# Patient Record
Sex: Female | Born: 1945 | Race: White | Hispanic: No | Marital: Married | Smoking: Former smoker
Health system: Southern US, Community
[De-identification: ages and names within clinical notes are randomized; demographics above are authoritative.]

## PROBLEM LIST (undated history)

## (undated) DIAGNOSIS — I10 Essential (primary) hypertension: Secondary | ICD-10-CM

## (undated) DIAGNOSIS — Z9221 Personal history of antineoplastic chemotherapy: Secondary | ICD-10-CM

## (undated) DIAGNOSIS — I00 Rheumatic fever without heart involvement: Secondary | ICD-10-CM

## (undated) DIAGNOSIS — E039 Hypothyroidism, unspecified: Secondary | ICD-10-CM

## (undated) DIAGNOSIS — C801 Malignant (primary) neoplasm, unspecified: Secondary | ICD-10-CM

## (undated) DIAGNOSIS — F32A Depression, unspecified: Secondary | ICD-10-CM

## (undated) DIAGNOSIS — T7840XA Allergy, unspecified, initial encounter: Secondary | ICD-10-CM

## (undated) DIAGNOSIS — F329 Major depressive disorder, single episode, unspecified: Secondary | ICD-10-CM

## (undated) DIAGNOSIS — E079 Disorder of thyroid, unspecified: Secondary | ICD-10-CM

## (undated) DIAGNOSIS — E785 Hyperlipidemia, unspecified: Secondary | ICD-10-CM

## (undated) HISTORY — DX: Allergy, unspecified, initial encounter: T78.40XA

## (undated) HISTORY — DX: Disorder of thyroid, unspecified: E07.9

## (undated) HISTORY — PX: POLYPECTOMY: SHX149

## (undated) HISTORY — PX: MASTECTOMY: SHX3

## (undated) HISTORY — PX: COLONOSCOPY: SHX174

## (undated) HISTORY — PX: BREAST RECONSTRUCTION: SHX9

## (undated) HISTORY — DX: Malignant (primary) neoplasm, unspecified: C80.1

## (undated) HISTORY — PX: BREAST IMPLANT REMOVAL: SHX5361

## (undated) HISTORY — DX: Hyperlipidemia, unspecified: E78.5

---

## 1980-09-17 HISTORY — PX: BREAST EXCISIONAL BIOPSY: SUR124

## 2000-10-04 ENCOUNTER — Ambulatory Visit (HOSPITAL_COMMUNITY): Admission: RE | Admit: 2000-10-04 | Discharge: 2000-10-04 | Payer: Self-pay | Admitting: Internal Medicine

## 2005-05-04 ENCOUNTER — Ambulatory Visit: Payer: Self-pay | Admitting: Obstetrics and Gynecology

## 2005-09-20 ENCOUNTER — Ambulatory Visit: Payer: Self-pay | Admitting: Internal Medicine

## 2005-09-28 ENCOUNTER — Encounter (INDEPENDENT_AMBULATORY_CARE_PROVIDER_SITE_OTHER): Payer: Self-pay | Admitting: *Deleted

## 2005-09-28 ENCOUNTER — Ambulatory Visit: Payer: Self-pay | Admitting: Internal Medicine

## 2006-05-06 ENCOUNTER — Ambulatory Visit: Payer: Self-pay | Admitting: Obstetrics and Gynecology

## 2006-05-09 ENCOUNTER — Ambulatory Visit: Payer: Self-pay | Admitting: Obstetrics and Gynecology

## 2006-05-31 ENCOUNTER — Encounter: Admission: RE | Admit: 2006-05-31 | Discharge: 2006-05-31 | Payer: Self-pay | Admitting: Obstetrics and Gynecology

## 2006-06-13 ENCOUNTER — Encounter (INDEPENDENT_AMBULATORY_CARE_PROVIDER_SITE_OTHER): Payer: Self-pay | Admitting: Specialist

## 2006-06-13 ENCOUNTER — Encounter: Admission: RE | Admit: 2006-06-13 | Discharge: 2006-06-13 | Payer: Self-pay | Admitting: Obstetrics and Gynecology

## 2006-06-13 HISTORY — PX: BREAST BIOPSY: SHX20

## 2007-06-23 ENCOUNTER — Encounter: Admission: RE | Admit: 2007-06-23 | Discharge: 2007-06-23 | Payer: Self-pay | Admitting: Obstetrics and Gynecology

## 2008-06-28 ENCOUNTER — Encounter: Admission: RE | Admit: 2008-06-28 | Discharge: 2008-06-28 | Payer: Self-pay | Admitting: Obstetrics and Gynecology

## 2009-08-03 ENCOUNTER — Encounter: Admission: RE | Admit: 2009-08-03 | Discharge: 2009-08-03 | Payer: Self-pay | Admitting: Obstetrics and Gynecology

## 2010-08-14 ENCOUNTER — Encounter: Admission: RE | Admit: 2010-08-14 | Discharge: 2010-08-14 | Payer: Self-pay | Admitting: Obstetrics and Gynecology

## 2010-09-26 ENCOUNTER — Encounter: Payer: Self-pay | Admitting: Internal Medicine

## 2010-10-19 NOTE — Letter (Signed)
Summary: Colonoscopy Letter  [facility]  [place], [place] [address]   Phone: [phone]  Fax: [phone]      September 26, 2010 MRN: [id number]   [facility] [address] [place], [place]  [address]   Dear Ms. Donahoo,   According to your medical record, it is time for you to schedule a Colonoscopy. The American Cancer Society recommends this procedure as a method to detect early colon cancer. Patients with a family history of colon cancer, or a personal history of colon polyps or inflammatory bowel disease are at increased risk.  This letter has been generated based on the recommendations made at the time of your procedure. If you feel that in your particular situation this may no longer apply, please contact our office.  Please call our office at [phone] to schedule this appointment or to update your records at your earliest convenience.  Thank you for cooperating with [place] to provide you with the very best care possible.   Sincerely,  Lowella Bandy. Olevia Perches, M.D.  [facility] Gastroenterology Division [phone]

## 2010-11-14 ENCOUNTER — Encounter (INDEPENDENT_AMBULATORY_CARE_PROVIDER_SITE_OTHER): Payer: Self-pay | Admitting: *Deleted

## 2010-11-23 NOTE — Letter (Signed)
Summary: Pre Visit Letter Revised  [facility]  [place], [place] [address]   Phone: [phone]  Fax: [phone]        11/14/2010 MRN: [id number] Michelle Lambert [address] [place], [place]  [address]             Procedure Date:  01-08-11           Recall Colon--Dr. Olevia Perches   Welcome to the Gastroenterology Division at [facility].    You are scheduled to see a nurse for your pre-procedure visit on 12-25-10 at 10:00a.m. on the 3rd floor at [facility], [employer].  We ask that you try to arrive at our office 15 minutes prior to your appointment time to allow for check-in.  Please take a minute to review the attached form.  If you answer "Yes" to one or more of the questions on the first page, we ask that you call the person listed at your earliest opportunity.  If you answer "No" to all of the questions, please complete the rest of the form and bring it to your appointment.    Your nurse visit will consist of discussing your medical and surgical history, your immediate family medical history, and your medications.   If you are unable to list all of your medications on the form, please bring the medication bottles to your appointment and we will list them.  We will need to be aware of both prescribed and over the counter drugs.  We will need to know exact dosage information as well.    Please be prepared to read and sign documents such as consent forms, a financial agreement, and acknowledgement forms.  If necessary, and with your consent, a friend or relative is welcome to sit-in on the nurse visit with you.  Please bring your insurance card so that we may make a copy of it.  If your insurance requires a referral to see a specialist, please bring your referral form from your primary care physician.  No co-pay is required for this nurse visit.     If you cannot keep your appointment, please call [phone] to cancel or reschedule prior to  your appointment date.  This allows [place] the opportunity to schedule an appointment for another patient in need of care.    Thank you for choosing [facility] for your medical needs.  We appreciate the opportunity to care for you.  Please visit [place] at our website  to learn more about our practice.  Sincerely, The Gastroenterology Division

## 2010-12-25 ENCOUNTER — Ambulatory Visit (AMBULATORY_SURGERY_CENTER): Payer: BLUE CROSS/BLUE SHIELD | Admitting: *Deleted

## 2010-12-25 VITALS — Ht 63.5 in | Wt 170.0 lb

## 2010-12-25 DIAGNOSIS — Z86010 Personal history of colonic polyps: Secondary | ICD-10-CM

## 2010-12-25 MED ORDER — PEG-KCL-NACL-NASULF-NA ASC-C 100 G PO SOLR: ORAL | Status: DC

## 2011-01-05 ENCOUNTER — Encounter: Payer: Self-pay | Admitting: Internal Medicine

## 2011-01-08 ENCOUNTER — Encounter: Payer: Self-pay | Admitting: Internal Medicine

## 2011-01-08 ENCOUNTER — Ambulatory Visit (AMBULATORY_SURGERY_CENTER): Payer: BLUE CROSS/BLUE SHIELD | Admitting: Internal Medicine

## 2011-01-08 VITALS — BP 167/87 | HR 62 | Temp 96.4°F | Resp 16

## 2011-01-08 DIAGNOSIS — Z860100 Personal history of colon polyps, unspecified: Secondary | ICD-10-CM

## 2011-01-08 DIAGNOSIS — D126 Benign neoplasm of colon, unspecified: Secondary | ICD-10-CM

## 2011-01-08 DIAGNOSIS — Z1211 Encounter for screening for malignant neoplasm of colon: Secondary | ICD-10-CM

## 2011-01-08 DIAGNOSIS — K5730 Diverticulosis of large intestine without perforation or abscess without bleeding: Secondary | ICD-10-CM

## 2011-01-08 MED ORDER — SODIUM CHLORIDE 0.9 % IV SOLN: 500.0000 mL | INTRAVENOUS | Status: DC

## 2011-01-08 NOTE — Patient Instructions (Signed)
Discharged instructions given with verbal understanding. Hand outs on polyps and diverticulosis given. Resume previous medications.

## 2011-01-09 ENCOUNTER — Telehealth: Payer: Self-pay | Admitting: *Deleted

## 2011-01-09 NOTE — Telephone Encounter (Signed)

## 2011-01-11 ENCOUNTER — Encounter: Payer: Self-pay | Admitting: Internal Medicine

## 2011-02-02 NOTE — Procedures (Signed)
[facility]  Patient:    Michelle Lambert, Michelle Lambert                          MRN: [id number] Adm. Date:  [id number] Attending:  Vincent Peyer CC:         Dr. Bo Mcclintock, [facility], [place], [place]   Procedure Report  PROCEDURE:  Colonoscopy.  ENDOSCOPIST:  Lowella Bandy. Olevia Perches, M.D.  INDICATIONS:  This 65 year old white female has a positive family history of colon polyps.  She has normal bowel habits.  She had breast CA with chemotherapy in the past.  She is currently asymptomatic except for history of hemorrhoids.  Previous Hemoccult cards were negative.  She is undergoing her first colonoscopy.  ENDOSCOPE:  Olympus single-channel videoscope.  SEDATION:  Versed 5 mg IV, Demerol 75 mg IV.  FINDINGS:  Olympus single-channel videoscope was passed directly in through the rectum to the sigmoid colon.  Patient was monitored by pulse oximetry; her oxygen saturations were normal.  Her prep was excellent.  Anal canal and rectal ampulla showed normal rectal ampulla with small internal hemorrhoids; these were visualized by retroflexion of the endoscope in the rectum.  There was a small flat-appearing polyp measuring 8 mm in diameter at the level of 20 cm from the rectum; it was removed with hot biopsy.  At the level of 60 cm from the rectum was another polyp which was diminutive, measured 5 mm and was also ablated with hot biopsy.  The rest of the colon was unremarkable.  Cecal pouch was reached without difficulty.  Mucosa appeared unremarkable. Ileocecal valve was normal.  Video-photographs were obtained.  Colonoscope was then retracted and the colon decompressed.  Patient tolerated procedure well.  IMPRESSION:  Two polyps of the left colon, status post polypectomies.  PLAN 1. Post-polypectomy instructions. 2. Repeat colonoscopy in three years. 3. Yearly Hemoccults. DD:  10/04/00 TD:  10/05/00 Job: [id number]

## 2011-04-03 ENCOUNTER — Other Ambulatory Visit: Payer: Self-pay | Admitting: Obstetrics and Gynecology

## 2011-04-03 DIAGNOSIS — Z9010 Acquired absence of unspecified breast and nipple: Secondary | ICD-10-CM

## 2011-08-20 ENCOUNTER — Ambulatory Visit: Payer: BLUE CROSS/BLUE SHIELD

## 2011-09-24 ENCOUNTER — Ambulatory Visit
Admission: RE | Admit: 2011-09-24 | Discharge: 2011-09-24 | Disposition: A | Discharge status: Home / Self Care | Payer: BLUE CROSS/BLUE SHIELD | Source: Ambulatory Visit | Attending: Obstetrics and Gynecology | Admitting: Obstetrics and Gynecology

## 2011-09-24 DIAGNOSIS — Z9010 Acquired absence of unspecified breast and nipple: Secondary | ICD-10-CM

## 2012-10-07 ENCOUNTER — Other Ambulatory Visit: Payer: Self-pay | Admitting: Obstetrics and Gynecology

## 2012-10-07 DIAGNOSIS — Z9882 Breast implant status: Secondary | ICD-10-CM

## 2012-10-07 DIAGNOSIS — Z1231 Encounter for screening mammogram for malignant neoplasm of breast: Secondary | ICD-10-CM

## 2012-10-07 DIAGNOSIS — Z9012 Acquired absence of left breast and nipple: Secondary | ICD-10-CM

## 2012-11-10 ENCOUNTER — Ambulatory Visit
Admission: RE | Admit: 2012-11-10 | Discharge: 2012-11-10 | Disposition: A | Discharge status: Home / Self Care | Payer: Medicare Other | Source: Ambulatory Visit | Attending: Obstetrics and Gynecology | Admitting: Obstetrics and Gynecology

## 2012-11-10 DIAGNOSIS — Z1231 Encounter for screening mammogram for malignant neoplasm of breast: Secondary | ICD-10-CM

## 2012-11-10 DIAGNOSIS — Z9012 Acquired absence of left breast and nipple: Secondary | ICD-10-CM

## 2012-11-10 DIAGNOSIS — Z9882 Breast implant status: Secondary | ICD-10-CM

## 2013-06-25 ENCOUNTER — Other Ambulatory Visit: Payer: Self-pay | Admitting: Obstetrics and Gynecology

## 2013-06-25 DIAGNOSIS — Z9012 Acquired absence of left breast and nipple: Secondary | ICD-10-CM

## 2013-06-25 DIAGNOSIS — Z1231 Encounter for screening mammogram for malignant neoplasm of breast: Secondary | ICD-10-CM

## 2013-11-11 ENCOUNTER — Ambulatory Visit: Payer: Medicare HMO

## 2013-11-16 ENCOUNTER — Ambulatory Visit
Admission: RE | Admit: 2013-11-16 | Discharge: 2013-11-16 | Disposition: A | Discharge status: Home / Self Care | Payer: Medicare Other | Source: Ambulatory Visit | Attending: Obstetrics and Gynecology | Admitting: Obstetrics and Gynecology

## 2013-11-16 DIAGNOSIS — Z9012 Acquired absence of left breast and nipple: Secondary | ICD-10-CM

## 2013-11-16 DIAGNOSIS — Z1231 Encounter for screening mammogram for malignant neoplasm of breast: Secondary | ICD-10-CM

## 2014-05-05 ENCOUNTER — Encounter: Payer: Self-pay | Admitting: Internal Medicine

## 2014-10-06 ENCOUNTER — Other Ambulatory Visit: Payer: Self-pay | Admitting: Obstetrics and Gynecology

## 2014-10-06 DIAGNOSIS — Z1231 Encounter for screening mammogram for malignant neoplasm of breast: Secondary | ICD-10-CM

## 2014-10-06 DIAGNOSIS — Z9012 Acquired absence of left breast and nipple: Secondary | ICD-10-CM

## 2014-10-06 DIAGNOSIS — Z853 Personal history of malignant neoplasm of breast: Secondary | ICD-10-CM

## 2014-11-18 ENCOUNTER — Ambulatory Visit
Admission: RE | Admit: 2014-11-18 | Discharge: 2014-11-18 | Disposition: A | Discharge status: Home / Self Care | Payer: Medicare Other | Source: Ambulatory Visit | Attending: Obstetrics and Gynecology | Admitting: Obstetrics and Gynecology

## 2014-11-18 DIAGNOSIS — Z9012 Acquired absence of left breast and nipple: Secondary | ICD-10-CM

## 2014-11-18 DIAGNOSIS — Z1231 Encounter for screening mammogram for malignant neoplasm of breast: Secondary | ICD-10-CM

## 2014-11-18 DIAGNOSIS — Z853 Personal history of malignant neoplasm of breast: Secondary | ICD-10-CM

## 2015-11-29 ENCOUNTER — Other Ambulatory Visit: Payer: Self-pay | Admitting: Obstetrics and Gynecology

## 2015-11-29 ENCOUNTER — Other Ambulatory Visit: Payer: Self-pay

## 2015-11-29 DIAGNOSIS — Z9011 Acquired absence of right breast and nipple: Secondary | ICD-10-CM

## 2015-11-29 DIAGNOSIS — Z1231 Encounter for screening mammogram for malignant neoplasm of breast: Secondary | ICD-10-CM

## 2015-12-23 ENCOUNTER — Ambulatory Visit
Admission: RE | Admit: 2015-12-23 | Discharge: 2015-12-23 | Disposition: A | Discharge status: Home / Self Care | Payer: Medicare Other | Source: Ambulatory Visit | Attending: Obstetrics and Gynecology | Admitting: Obstetrics and Gynecology

## 2015-12-23 DIAGNOSIS — Z1231 Encounter for screening mammogram for malignant neoplasm of breast: Secondary | ICD-10-CM

## 2015-12-23 DIAGNOSIS — Z9011 Acquired absence of right breast and nipple: Secondary | ICD-10-CM

## 2015-12-27 ENCOUNTER — Other Ambulatory Visit: Payer: Self-pay | Admitting: Obstetrics and Gynecology

## 2015-12-27 DIAGNOSIS — R928 Other abnormal and inconclusive findings on diagnostic imaging of breast: Secondary | ICD-10-CM

## 2016-01-05 ENCOUNTER — Ambulatory Visit
Admission: RE | Admit: 2016-01-05 | Discharge: 2016-01-05 | Disposition: A | Discharge status: Home / Self Care | Payer: Medicare Other | Source: Ambulatory Visit | Attending: Obstetrics and Gynecology | Admitting: Obstetrics and Gynecology

## 2016-01-05 DIAGNOSIS — R928 Other abnormal and inconclusive findings on diagnostic imaging of breast: Secondary | ICD-10-CM

## 2016-07-20 ENCOUNTER — Telehealth: Payer: Self-pay | Admitting: Internal Medicine

## 2016-07-20 NOTE — Telephone Encounter (Signed)
Left message for patient to return my call.

## 2016-07-20 NOTE — Telephone Encounter (Signed)
Please advise on recall for her colonoscopy.

## 2016-07-20 NOTE — Telephone Encounter (Signed)
Keep recall as is 2019

## 2016-12-06 ENCOUNTER — Other Ambulatory Visit: Payer: Self-pay | Admitting: Obstetrics and Gynecology

## 2016-12-06 DIAGNOSIS — Z1231 Encounter for screening mammogram for malignant neoplasm of breast: Secondary | ICD-10-CM

## 2016-12-28 ENCOUNTER — Ambulatory Visit: Payer: Medicare Other

## 2017-01-18 ENCOUNTER — Ambulatory Visit
Admission: RE | Admit: 2017-01-18 | Discharge: 2017-01-18 | Disposition: A | Discharge status: Home / Self Care | Payer: Medicare Other | Source: Ambulatory Visit | Attending: Obstetrics and Gynecology | Admitting: Obstetrics and Gynecology

## 2017-01-18 ENCOUNTER — Other Ambulatory Visit: Payer: Self-pay | Admitting: Obstetrics and Gynecology

## 2017-01-18 DIAGNOSIS — Z1231 Encounter for screening mammogram for malignant neoplasm of breast: Secondary | ICD-10-CM

## 2017-12-31 ENCOUNTER — Encounter: Payer: Self-pay | Admitting: Gastroenterology

## 2018-01-16 ENCOUNTER — Other Ambulatory Visit: Payer: Self-pay | Admitting: Family Medicine

## 2018-01-16 DIAGNOSIS — I6521 Occlusion and stenosis of right carotid artery: Secondary | ICD-10-CM

## 2018-01-20 ENCOUNTER — Ambulatory Visit
Admission: RE | Admit: 2018-01-20 | Discharge: 2018-01-20 | Disposition: A | Discharge status: Home / Self Care | Payer: Medicare Other | Source: Ambulatory Visit | Attending: Family Medicine | Admitting: Family Medicine

## 2018-01-20 DIAGNOSIS — I6523 Occlusion and stenosis of bilateral carotid arteries: Secondary | ICD-10-CM | POA: Diagnosis not present

## 2018-01-20 DIAGNOSIS — I6521 Occlusion and stenosis of right carotid artery: Secondary | ICD-10-CM | POA: Diagnosis present

## 2018-01-28 ENCOUNTER — Other Ambulatory Visit: Payer: Self-pay | Admitting: Obstetrics and Gynecology

## 2018-01-28 DIAGNOSIS — Z1231 Encounter for screening mammogram for malignant neoplasm of breast: Secondary | ICD-10-CM

## 2018-03-31 ENCOUNTER — Ambulatory Visit
Admission: RE | Admit: 2018-03-31 | Discharge: 2018-03-31 | Disposition: A | Discharge status: Home / Self Care | Payer: Medicare Other | Source: Ambulatory Visit | Attending: Obstetrics and Gynecology | Admitting: Obstetrics and Gynecology

## 2018-03-31 DIAGNOSIS — Z1231 Encounter for screening mammogram for malignant neoplasm of breast: Secondary | ICD-10-CM

## 2018-06-05 ENCOUNTER — Encounter: Payer: Self-pay | Admitting: *Deleted

## 2018-06-06 ENCOUNTER — Ambulatory Visit: Payer: Medicare Other | Admitting: Anesthesiology

## 2018-06-06 ENCOUNTER — Encounter
Admission: RE | Disposition: A | Discharge status: Home / Self Care | Payer: Self-pay | Source: Ambulatory Visit | Attending: Gastroenterology

## 2018-06-06 ENCOUNTER — Ambulatory Visit
Admission: RE | Admit: 2018-06-06 | Discharge: 2018-06-06 | Disposition: A | Discharge status: Home / Self Care | Payer: Medicare Other | Source: Ambulatory Visit | Attending: Gastroenterology | Admitting: Gastroenterology

## 2018-06-06 ENCOUNTER — Encounter: Payer: Self-pay | Admitting: *Deleted

## 2018-06-06 DIAGNOSIS — Z86010 Personal history of colonic polyps: Secondary | ICD-10-CM | POA: Insufficient documentation

## 2018-06-06 DIAGNOSIS — K5730 Diverticulosis of large intestine without perforation or abscess without bleeding: Secondary | ICD-10-CM | POA: Diagnosis not present

## 2018-06-06 DIAGNOSIS — E785 Hyperlipidemia, unspecified: Secondary | ICD-10-CM | POA: Insufficient documentation

## 2018-06-06 DIAGNOSIS — Z9221 Personal history of antineoplastic chemotherapy: Secondary | ICD-10-CM | POA: Insufficient documentation

## 2018-06-06 DIAGNOSIS — E039 Hypothyroidism, unspecified: Secondary | ICD-10-CM | POA: Insufficient documentation

## 2018-06-06 DIAGNOSIS — Z7982 Long term (current) use of aspirin: Secondary | ICD-10-CM | POA: Insufficient documentation

## 2018-06-06 DIAGNOSIS — Z79899 Other long term (current) drug therapy: Secondary | ICD-10-CM | POA: Diagnosis not present

## 2018-06-06 DIAGNOSIS — I10 Essential (primary) hypertension: Secondary | ICD-10-CM | POA: Diagnosis not present

## 2018-06-06 DIAGNOSIS — Z79890 Hormone replacement therapy (postmenopausal): Secondary | ICD-10-CM | POA: Diagnosis not present

## 2018-06-06 DIAGNOSIS — D123 Benign neoplasm of transverse colon: Secondary | ICD-10-CM | POA: Diagnosis not present

## 2018-06-06 DIAGNOSIS — D127 Benign neoplasm of rectosigmoid junction: Secondary | ICD-10-CM | POA: Insufficient documentation

## 2018-06-06 DIAGNOSIS — K640 First degree hemorrhoids: Secondary | ICD-10-CM | POA: Diagnosis not present

## 2018-06-06 DIAGNOSIS — Z87891 Personal history of nicotine dependence: Secondary | ICD-10-CM | POA: Diagnosis not present

## 2018-06-06 DIAGNOSIS — Z9012 Acquired absence of left breast and nipple: Secondary | ICD-10-CM | POA: Diagnosis not present

## 2018-06-06 HISTORY — PX: COLONOSCOPY WITH PROPOFOL: SHX5780

## 2018-06-06 HISTORY — DX: Depression, unspecified: F32.A

## 2018-06-06 HISTORY — DX: Hypothyroidism, unspecified: E03.9

## 2018-06-06 HISTORY — DX: Essential (primary) hypertension: I10

## 2018-06-06 HISTORY — DX: Major depressive disorder, single episode, unspecified: F32.9

## 2018-06-06 HISTORY — DX: Rheumatic fever without heart involvement: I00

## 2018-06-06 SURGERY — COLONOSCOPY WITH PROPOFOL
Anesthesia: General

## 2018-06-06 MED ORDER — SODIUM CHLORIDE 0.9 % IV SOLN
INTRAVENOUS | Status: DC
  Administered 2018-06-06: 10:00:00 via INTRAVENOUS

## 2018-06-06 MED ORDER — PROPOFOL 500 MG/50ML IV EMUL
INTRAVENOUS | Status: AC
  Filled 2018-06-06: qty 50

## 2018-06-06 MED ORDER — SODIUM CHLORIDE 0.9 % IV SOLN
INTRAVENOUS | Status: DC
  Administered 2018-06-06: 1000 mL via INTRAVENOUS

## 2018-06-06 MED ORDER — PROPOFOL 10 MG/ML IV BOLUS
INTRAVENOUS | Status: DC | PRN
  Administered 2018-06-06 (×2): 50 mg via INTRAVENOUS
  Administered 2018-06-06: 20 mg via INTRAVENOUS

## 2018-06-06 MED ORDER — LIDOCAINE HCL (PF) 1 % IJ SOLN
INTRAMUSCULAR | Status: AC
  Administered 2018-06-06: 0.3 mL
  Filled 2018-06-06: qty 2

## 2018-06-06 MED ORDER — PROPOFOL 500 MG/50ML IV EMUL
INTRAVENOUS | Status: DC | PRN
  Administered 2018-06-06: 60 ug/kg/min via INTRAVENOUS

## 2018-06-06 NOTE — Transfer of Care (Signed)
Immediate Anesthesia Transfer of Care Note  Patient: Michelle Lambert  Procedure(s) Performed: COLONOSCOPY WITH PROPOFOL (N/A )  Patient Location: PACU  Anesthesia Type:General  Level of Consciousness: awake  Airway & Oxygen Therapy: Patient Spontanous Breathing  Post-op Assessment: Report given to RN  Post vital signs: stable  Last Vitals:  Vitals Value Taken Time  BP    Temp    Pulse    Resp    SpO2      Last Pain:  Vitals:   06/06/18 0910  TempSrc: Tympanic  PainSc: 0-No pain         Complications: No apparent anesthesia complications

## 2018-06-06 NOTE — H&P (Signed)
Outpatient short stay form Pre-procedure 06/06/2018 9:36 AM Michelle Sails MD  Primary Physician: Maryland Pink MD  Reason for visit: Colonoscopy  History of present illness: Patient is a 72 year old female presenting today for a colonoscopy.  She has personal history of colon polyps.  Her last colonoscopy was in 2012.  Tolerated prep well.  She does take a daily 81 mg aspirin that is been held.  She takes no other aspirin products or blood thinning agent.    Current Facility-Administered Medications:  .  0.9 %  sodium chloride infusion, , Intravenous, Continuous, Michelle Sails, MD .  0.9 %  sodium chloride infusion, , Intravenous, Continuous, Michelle Sails, MD, Last Rate: 20 mL/hr at 06/06/18 0928, 1,000 mL at 06/06/18 9811  Facility-Administered Medications Prior to Admission  Medication Dose Route Frequency Provider Last Rate Last Dose  . 0.9 %  sodium chloride infusion  500 mL Intravenous Continuous Lafayette Dragon, MD       Medications Prior to Admission  Medication Sig Dispense Refill Last Dose  . b complex vitamins tablet Take 1 tablet by mouth daily.     . candesartan-hydrochlorothiazide (ATACAND HCT) 16-12.5 MG tablet Take 1 tablet by mouth daily.   06/06/2018 at 0700  . alendronate-cholecalciferol (FOSAMAX PLUS D) 70-2800 MG-UNIT per tablet Take 1 tablet by mouth every 7 (seven) days. Take with a full glass of water on an empty stomach.    Past Week  . Ascorbic Acid (VITAMIN C) 1000 MG tablet Take 1,000 mg by mouth daily.     Past Week  . aspirin 81 MG EC tablet Take 81 mg by mouth daily.     05/30/2018  . calcium-vitamin D (OSCAL-500) 500-400 MG-UNIT per tablet Take 1 tablet by mouth daily.     Past Week  . levothyroxine (SYNTHROID, LEVOTHROID) 100 MCG tablet Take 100 mcg by mouth daily.     06/06/2018 at 0700  . Multiple Vitamins-Minerals (MULTIVITAMIN WITH MINERALS) tablet Take 1 tablet by mouth daily.     Past Week  . omega-3 acid ethyl esters (LOVAZA) 1 G capsule  Take 1 g by mouth daily.     Past Week  . peg 3350 powder (MOVIPREP) 100 G SOLR MOVI PREP take as directed 1 kit 0 01/08/2011  . rosuvastatin (CRESTOR) 20 MG tablet Take 20 mg by mouth daily.     Past Week  . vitamin E 400 UNIT capsule Take 400 Units by mouth daily.     Past Week     No Known Allergies   Past Medical History:  Diagnosis Date  . Allergy    seasonal  . Cancer ([place])    left/chemo 1989  . Depression   . Hyperlipidemia   . Hypertension   . Hypothyroidism   . Rheumatic fever   . Thyroid disease     Review of systems:      Physical Exam    Heart and lungs: Regular rate and rhythm without rub or gallop, lungs are bilaterally clear.    HEENT: Normocephalic atraumatic eyes are anicteric    Other:    Pertinant exam for procedure: Soft nontender nondistended bowel sounds positive normoactive    Planned proceedures: Colonoscopy and indicated procedures. I have discussed the risks benefits and complications of procedures to include not limited to bleeding, infection, perforation and the risk of sedation and the patient wishes to proceed.    Michelle Sails, MD Gastroenterology 06/06/2018  9:36 AM

## 2018-06-06 NOTE — Anesthesia Post-op Follow-up Note (Signed)
Anesthesia QCDR form completed.        

## 2018-06-06 NOTE — Op Note (Signed)
[facility] Gastroenterology Patient Name: Michelle Lambert Procedure Date: 06/06/2018 9:39 AM MRN: [id number] Account #: [id number] Date of Birth: November 04, 1945 Admit Type: Outpatient Age: 72 Room: [facility] ENDO ROOM 3 Gender: Female Note Status: Finalized Procedure:            Colonoscopy Indications:          Personal history of colonic polyps Providers:            Lollie Sails, MD Referring MD:         Irven Easterly. Kary Kos, MD (Referring MD) Medicines:            Monitored Anesthesia Care Complications:        No immediate complications. Procedure:            Pre-Anesthesia Assessment:                       - ASA Grade Assessment: II - A patient with mild                        systemic disease.                       After obtaining informed consent, the colonoscope was                        passed under direct vision. Throughout the procedure,                        the patient's blood pressure, pulse, and oxygen                        saturations were monitored continuously. The                        Colonoscope was introduced through the anus and                        advanced to the the cecum, identified by appendiceal                        orifice and ileocecal valve. The patient tolerated the                        procedure well. The quality of the bowel preparation                        was good. Findings:      Two sessile polyps were found in the hepatic flexure. The polyps were 8       to 10 mm in size. These polyps were removed with a lift and cut       technique using a hot snare. Resection and retrieval were complete.      A 2 mm polyp was found in the hepatic flexure. The polyp was sessile.       The polyp was removed with a cold biopsy forceps. Resection and       retrieval were complete, placed in same jar as first two.      Four sessile polyps were found in the recto-sigmoid colon. The polyps       were 1 to 2 mm in size. These polyps were  removed with a cold biopsy       forceps. Resection and retrieval were complete.      Multiple small-mouthed diverticula were found in the sigmoid colon and       descending colon.      The terminal ileum appeared normal.      Non-bleeding internal hemorrhoids were found during retroflexion and       during anoscopy. The hemorrhoids were small and Grade I (internal       hemorrhoids that do not prolapse). Impression:           - Two 8 to 10 mm polyps at the hepatic flexure, removed                        using lift and cut and a hot snare. Resected and                        retrieved.                       - One 2 mm polyp at the hepatic flexure, removed with a                        cold biopsy forceps. Resected and retrieved.                       - Four 1 to 2 mm polyps at the recto-sigmoid colon,                        removed with a cold biopsy forceps. Resected and                        retrieved.                       - Diverticulosis in the sigmoid colon and in the                        descending colon.                       - The examined portion of the ileum was normal.                       - Non-bleeding internal hemorrhoids. Recommendation:       - Discharge patient to home.                       - Full liquid diet today.                       - Soft diet for 1 day, then advance as tolerated to                        advance diet as tolerated.                       - Telephone GI clinic for pathology results in 1 week. Procedure Code(s):    --- Professional ---                       [phone], Colonoscopy, flexible; with removal  of tumor(s),                        polyp(s), or other lesion(s) by snare technique                       45380, 59, Colonoscopy, flexible; with biopsy, single                        or multiple Diagnosis Code(s):    --- Professional ---                       D12.3, Benign neoplasm of transverse colon (hepatic                        flexure or  splenic flexure)                       D12.7, Benign neoplasm of rectosigmoid junction                       K64.0, First degree hemorrhoids                       Z86.010, Personal history of colonic polyps                       K57.30, Diverticulosis of large intestine without                        perforation or abscess without bleeding CPT copyright 2017 American Medical Association. All rights reserved. The codes documented in this report are preliminary and upon coder review may  be revised to meet current compliance requirements. Lollie Sails, MD 06/06/2018 10:25:04 AM This report has been signed electronically. Number of Addenda: 0 Note Initiated On: 06/06/2018 9:39 AM Scope Withdrawal Time: 0 hours 9 minutes 55 seconds  Total Procedure Duration: 0 hours 29 minutes 46 seconds       [facility]

## 2018-06-06 NOTE — Anesthesia Preprocedure Evaluation (Signed)
Anesthesia Evaluation  Patient identified by MRN, date of birth, ID band Patient awake    Reviewed: Allergy & Precautions, H&P , NPO status , Patient's Chart, lab work & pertinent test results, reviewed documented beta blocker date and time   Airway Mallampati: II   Neck ROM: full    Dental  (+) Poor Dentition   Pulmonary neg pulmonary ROS, former smoker,    Pulmonary exam normal        Cardiovascular Exercise Tolerance: Good hypertension, On Medications negative cardio ROS Normal cardiovascular exam Rhythm:regular Rate:Normal     Neuro/Psych PSYCHIATRIC DISORDERS Depression negative neurological ROS  negative psych ROS   GI/Hepatic negative GI ROS, Neg liver ROS,   Endo/Other  negative endocrine ROSHypothyroidism   Renal/GU negative Renal ROS  negative genitourinary   Musculoskeletal   Abdominal   Peds  Hematology negative hematology ROS (+)   Anesthesia Other Findings Past Medical History: No date: Allergy     Comment:  seasonal No date: Cancer [facility])     Comment:  left/chemo 1989 No date: Depression No date: Hyperlipidemia No date: Hypertension No date: Hypothyroidism No date: Rheumatic fever No date: Thyroid disease Past Surgical History: 06/13/2006: BREAST BIOPSY; Right     Comment:  benign 1982: BREAST EXCISIONAL BIOPSY; Right     Comment:  benign No date: BREAST IMPLANT REMOVAL     Comment:  left with replacement implant 2011 No date: BREAST RECONSTRUCTION     Comment:  [phone] left No date: COLONOSCOPY No date: MASTECTOMY; Left     Comment:  left 1989 No date: POLYPECTOMY BMI    Body Mass Index:  28.17 kg/m     Reproductive/Obstetrics negative OB ROS                             Anesthesia Physical Anesthesia Plan  ASA: II  Anesthesia Plan: General   Post-op Pain Management:    Induction:   PONV Risk Score and Plan:   Airway Management Planned:    Additional Equipment:   Intra-op Plan:   Post-operative Plan:   Informed Consent: I have reviewed the patients History and Physical, chart, labs and discussed the procedure including the risks, benefits and alternatives for the proposed anesthesia with the patient or authorized representative who has indicated his/her understanding and acceptance.   Dental Advisory Given  Plan Discussed with: CRNA  Anesthesia Plan Comments:         Anesthesia Quick Evaluation

## 2018-06-08 ENCOUNTER — Encounter: Payer: Self-pay | Admitting: Gastroenterology

## 2018-06-09 NOTE — Anesthesia Postprocedure Evaluation (Signed)
Anesthesia Post Note  Patient: Michelle Lambert  Procedure(s) Performed: COLONOSCOPY WITH PROPOFOL (N/A )  Patient location during evaluation: PACU Anesthesia Type: General Level of consciousness: awake and alert Pain management: pain level controlled Vital Signs Assessment: post-procedure vital signs reviewed and stable Respiratory status: spontaneous breathing, nonlabored ventilation, respiratory function stable and patient connected to nasal cannula oxygen Cardiovascular status: blood pressure returned to baseline and stable Postop Assessment: no apparent nausea or vomiting Anesthetic complications: no     Last Vitals:  Vitals:   06/06/18 1028 06/06/18 1045  BP: (!) 101/45 (!) 143/81  Pulse:    Resp:    Temp: (!) 36.1 C   SpO2:      Last Pain:  Vitals:   06/06/18 1045  TempSrc:   PainSc: 0-No pain                 Molli Barrows

## 2018-06-10 LAB — SURGICAL PATHOLOGY

## 2019-11-10 ENCOUNTER — Other Ambulatory Visit: Payer: Self-pay | Admitting: Obstetrics and Gynecology

## 2019-11-10 DIAGNOSIS — Z124 Encounter for screening for malignant neoplasm of cervix: Secondary | ICD-10-CM | POA: Diagnosis not present

## 2019-11-10 DIAGNOSIS — Z01419 Encounter for gynecological examination (general) (routine) without abnormal findings: Secondary | ICD-10-CM | POA: Diagnosis not present

## 2019-11-10 DIAGNOSIS — Z1231 Encounter for screening mammogram for malignant neoplasm of breast: Secondary | ICD-10-CM | POA: Diagnosis not present

## 2019-11-20 DIAGNOSIS — H6591 Unspecified nonsuppurative otitis media, right ear: Secondary | ICD-10-CM | POA: Diagnosis not present

## 2019-11-21 DIAGNOSIS — J029 Acute pharyngitis, unspecified: Secondary | ICD-10-CM | POA: Diagnosis not present

## 2019-11-21 DIAGNOSIS — H6691 Otitis media, unspecified, right ear: Secondary | ICD-10-CM | POA: Diagnosis not present

## 2019-11-23 DIAGNOSIS — E079 Disorder of thyroid, unspecified: Secondary | ICD-10-CM | POA: Diagnosis not present

## 2019-11-26 DIAGNOSIS — H66001 Acute suppurative otitis media without spontaneous rupture of ear drum, right ear: Secondary | ICD-10-CM | POA: Diagnosis not present

## 2019-12-01 DIAGNOSIS — H9201 Otalgia, right ear: Secondary | ICD-10-CM | POA: Diagnosis not present

## 2019-12-01 DIAGNOSIS — Z03818 Encounter for observation for suspected exposure to other biological agents ruled out: Secondary | ICD-10-CM | POA: Diagnosis not present

## 2019-12-01 DIAGNOSIS — H6980 Other specified disorders of Eustachian tube, unspecified ear: Secondary | ICD-10-CM | POA: Diagnosis not present

## 2019-12-04 DIAGNOSIS — L980 Pyogenic granuloma: Secondary | ICD-10-CM | POA: Diagnosis not present

## 2019-12-04 DIAGNOSIS — K219 Gastro-esophageal reflux disease without esophagitis: Secondary | ICD-10-CM | POA: Diagnosis not present

## 2019-12-04 DIAGNOSIS — H9201 Otalgia, right ear: Secondary | ICD-10-CM | POA: Diagnosis not present

## 2019-12-15 ENCOUNTER — Ambulatory Visit
Admission: RE | Admit: 2019-12-15 | Discharge: 2019-12-15 | Disposition: A | Discharge status: Home / Self Care | Payer: PPO | Source: Ambulatory Visit | Attending: Obstetrics and Gynecology | Admitting: Obstetrics and Gynecology

## 2019-12-15 ENCOUNTER — Other Ambulatory Visit: Payer: Self-pay

## 2019-12-15 DIAGNOSIS — Z1231 Encounter for screening mammogram for malignant neoplasm of breast: Secondary | ICD-10-CM

## 2019-12-23 DIAGNOSIS — L980 Pyogenic granuloma: Secondary | ICD-10-CM | POA: Diagnosis not present

## 2019-12-23 DIAGNOSIS — J309 Allergic rhinitis, unspecified: Secondary | ICD-10-CM | POA: Diagnosis not present

## 2019-12-23 DIAGNOSIS — K219 Gastro-esophageal reflux disease without esophagitis: Secondary | ICD-10-CM | POA: Diagnosis not present

## 2020-01-25 DIAGNOSIS — R05 Cough: Secondary | ICD-10-CM | POA: Diagnosis not present

## 2020-01-25 DIAGNOSIS — F3342 Major depressive disorder, recurrent, in full remission: Secondary | ICD-10-CM | POA: Diagnosis not present

## 2020-01-25 DIAGNOSIS — R0982 Postnasal drip: Secondary | ICD-10-CM | POA: Diagnosis not present

## 2020-01-25 DIAGNOSIS — E039 Hypothyroidism, unspecified: Secondary | ICD-10-CM | POA: Diagnosis not present

## 2020-01-25 DIAGNOSIS — J019 Acute sinusitis, unspecified: Secondary | ICD-10-CM | POA: Diagnosis not present

## 2020-01-25 DIAGNOSIS — R5383 Other fatigue: Secondary | ICD-10-CM | POA: Diagnosis not present

## 2020-01-25 DIAGNOSIS — C801 Malignant (primary) neoplasm, unspecified: Secondary | ICD-10-CM | POA: Diagnosis not present

## 2020-01-25 DIAGNOSIS — Z79899 Other long term (current) drug therapy: Secondary | ICD-10-CM | POA: Diagnosis not present

## 2020-01-25 DIAGNOSIS — R110 Nausea: Secondary | ICD-10-CM | POA: Diagnosis not present

## 2020-01-26 ENCOUNTER — Emergency Department: Payer: PPO

## 2020-01-26 ENCOUNTER — Other Ambulatory Visit: Payer: Self-pay

## 2020-01-26 ENCOUNTER — Observation Stay: Payer: PPO

## 2020-01-26 ENCOUNTER — Encounter: Payer: Self-pay | Admitting: Emergency Medicine

## 2020-01-26 ENCOUNTER — Inpatient Hospital Stay
Admission: EM | Admit: 2020-01-26 | Discharge: 2020-02-02 | DRG: 683 | Disposition: A | Discharge status: Home / Self Care | Payer: PPO | Attending: Family Medicine | Admitting: Family Medicine

## 2020-01-26 DIAGNOSIS — Z79890 Hormone replacement therapy (postmenopausal): Secondary | ICD-10-CM | POA: Diagnosis not present

## 2020-01-26 DIAGNOSIS — R809 Proteinuria, unspecified: Secondary | ICD-10-CM | POA: Diagnosis present

## 2020-01-26 DIAGNOSIS — Z9221 Personal history of antineoplastic chemotherapy: Secondary | ICD-10-CM | POA: Diagnosis not present

## 2020-01-26 DIAGNOSIS — E872 Acidosis: Secondary | ICD-10-CM | POA: Diagnosis present

## 2020-01-26 DIAGNOSIS — Z87891 Personal history of nicotine dependence: Secondary | ICD-10-CM | POA: Diagnosis not present

## 2020-01-26 DIAGNOSIS — R1110 Vomiting, unspecified: Secondary | ICD-10-CM

## 2020-01-26 DIAGNOSIS — K219 Gastro-esophageal reflux disease without esophagitis: Secondary | ICD-10-CM | POA: Diagnosis present

## 2020-01-26 DIAGNOSIS — D649 Anemia, unspecified: Secondary | ICD-10-CM | POA: Diagnosis present

## 2020-01-26 DIAGNOSIS — E039 Hypothyroidism, unspecified: Secondary | ICD-10-CM | POA: Diagnosis present

## 2020-01-26 DIAGNOSIS — Z7982 Long term (current) use of aspirin: Secondary | ICD-10-CM

## 2020-01-26 DIAGNOSIS — I10 Essential (primary) hypertension: Secondary | ICD-10-CM | POA: Diagnosis present

## 2020-01-26 DIAGNOSIS — N2581 Secondary hyperparathyroidism of renal origin: Secondary | ICD-10-CM | POA: Diagnosis present

## 2020-01-26 DIAGNOSIS — E785 Hyperlipidemia, unspecified: Secondary | ICD-10-CM

## 2020-01-26 DIAGNOSIS — R3129 Other microscopic hematuria: Secondary | ICD-10-CM | POA: Diagnosis present

## 2020-01-26 DIAGNOSIS — Z20822 Contact with and (suspected) exposure to covid-19: Secondary | ICD-10-CM | POA: Diagnosis present

## 2020-01-26 DIAGNOSIS — N179 Acute kidney failure, unspecified: Secondary | ICD-10-CM

## 2020-01-26 DIAGNOSIS — L980 Pyogenic granuloma: Secondary | ICD-10-CM | POA: Diagnosis not present

## 2020-01-26 DIAGNOSIS — D631 Anemia in chronic kidney disease: Secondary | ICD-10-CM | POA: Diagnosis not present

## 2020-01-26 DIAGNOSIS — E440 Moderate protein-calorie malnutrition: Secondary | ICD-10-CM | POA: Diagnosis present

## 2020-01-26 DIAGNOSIS — R0602 Shortness of breath: Secondary | ICD-10-CM | POA: Diagnosis not present

## 2020-01-26 DIAGNOSIS — Z853 Personal history of malignant neoplasm of breast: Secondary | ICD-10-CM

## 2020-01-26 DIAGNOSIS — R319 Hematuria, unspecified: Secondary | ICD-10-CM | POA: Diagnosis not present

## 2020-01-26 DIAGNOSIS — E876 Hypokalemia: Secondary | ICD-10-CM | POA: Diagnosis not present

## 2020-01-26 DIAGNOSIS — Z8249 Family history of ischemic heart disease and other diseases of the circulatory system: Secondary | ICD-10-CM | POA: Diagnosis not present

## 2020-01-26 DIAGNOSIS — D72829 Elevated white blood cell count, unspecified: Secondary | ICD-10-CM | POA: Diagnosis present

## 2020-01-26 DIAGNOSIS — N185 Chronic kidney disease, stage 5: Secondary | ICD-10-CM | POA: Diagnosis not present

## 2020-01-26 DIAGNOSIS — N170 Acute kidney failure with tubular necrosis: Secondary | ICD-10-CM | POA: Diagnosis present

## 2020-01-26 DIAGNOSIS — R112 Nausea with vomiting, unspecified: Secondary | ICD-10-CM | POA: Diagnosis not present

## 2020-01-26 DIAGNOSIS — E860 Dehydration: Secondary | ICD-10-CM | POA: Diagnosis present

## 2020-01-26 DIAGNOSIS — Z9012 Acquired absence of left breast and nipple: Secondary | ICD-10-CM | POA: Diagnosis not present

## 2020-01-26 DIAGNOSIS — K224 Dyskinesia of esophagus: Secondary | ICD-10-CM | POA: Diagnosis not present

## 2020-01-26 DIAGNOSIS — Z825 Family history of asthma and other chronic lower respiratory diseases: Secondary | ICD-10-CM | POA: Diagnosis not present

## 2020-01-26 DIAGNOSIS — Z03818 Encounter for observation for suspected exposure to other biological agents ruled out: Secondary | ICD-10-CM | POA: Diagnosis not present

## 2020-01-26 LAB — CBC
HCT: 29.4 % — ABNORMAL LOW (ref 36.0–46.0)
Hemoglobin: 10.3 g/dL — ABNORMAL LOW (ref 12.0–15.0)
MCH: 30 pg (ref 26.0–34.0)
MCHC: 35 g/dL (ref 30.0–36.0)
MCV: 85.7 fL (ref 80.0–100.0)
Platelets: 321 10*3/uL (ref 150–400)
RBC: 3.43 MIL/uL — ABNORMAL LOW (ref 3.87–5.11)
RDW: 12.1 % (ref 11.5–15.5)
WBC: 11.4 10*3/uL — ABNORMAL HIGH (ref 4.0–10.5)
nRBC: 0 % (ref 0.0–0.2)

## 2020-01-26 LAB — URINALYSIS, COMPLETE (UACMP) WITH MICROSCOPIC
Bacteria, UA: NONE SEEN
Bilirubin Urine: NEGATIVE
Glucose, UA: NEGATIVE mg/dL
Ketones, ur: NEGATIVE mg/dL
Nitrite: NEGATIVE
Protein, ur: 100 mg/dL — AB
Specific Gravity, Urine: 1.008 (ref 1.005–1.030)
WBC, UA: >50 WBC/hpf — ABNORMAL HIGH (ref 0–5)
pH: 7 (ref 5.0–8.0)

## 2020-01-26 LAB — COMPREHENSIVE METABOLIC PANEL
ALT: 11 U/L (ref 0–44)
AST: 15 U/L (ref 15–41)
Albumin: 3.4 g/dL — ABNORMAL LOW (ref 3.5–5.0)
Alkaline Phosphatase: 55 U/L (ref 38–126)
Anion gap: 12 (ref 5–15)
BUN: 67 mg/dL — ABNORMAL HIGH (ref 8–23)
CO2: 20 mmol/L — ABNORMAL LOW (ref 22–32)
Calcium: 9.1 mg/dL (ref 8.9–10.3)
Chloride: 103 mmol/L (ref 98–111)
Creatinine, Ser: 8.01 mg/dL — ABNORMAL HIGH (ref 0.44–1.00)
GFR calc Af Amer: 5 mL/min — ABNORMAL LOW (ref >60)
GFR calc non Af Amer: 5 mL/min — ABNORMAL LOW (ref >60)
Glucose, Bld: 106 mg/dL — ABNORMAL HIGH (ref 70–99)
Potassium: 3.8 mmol/L (ref 3.5–5.1)
Sodium: 135 mmol/L (ref 135–145)
Total Bilirubin: 1 mg/dL (ref 0.3–1.2)
Total Protein: 7.8 g/dL (ref 6.5–8.1)

## 2020-01-26 LAB — SARS CORONAVIRUS 2 BY RT PCR (HOSPITAL ORDER, PERFORMED IN ~~LOC~~ HOSPITAL LAB): SARS Coronavirus 2: NEGATIVE

## 2020-01-26 LAB — CREATININE, URINE, RANDOM: Creatinine, Urine: 77 mg/dL

## 2020-01-26 MED ORDER — ASPIRIN EC 81 MG PO TBEC
81.0000 mg | DELAYED_RELEASE_TABLET | Freq: Every day | ORAL | Status: DC
  Administered 2020-01-27 – 2020-02-01 (×6): 81 mg via ORAL
  Filled 2020-01-26 (×6): qty 1

## 2020-01-26 MED ORDER — MONTELUKAST SODIUM 10 MG PO TABS
10.0000 mg | ORAL_TABLET | Freq: Every day | ORAL | Status: DC
  Administered 2020-01-27 – 2020-02-01 (×6): 10 mg via ORAL
  Filled 2020-01-26 (×6): qty 1

## 2020-01-26 MED ORDER — AMLODIPINE BESYLATE 5 MG PO TABS
5.0000 mg | ORAL_TABLET | Freq: Every day | ORAL | Status: DC
  Administered 2020-01-27 – 2020-02-02 (×7): 5 mg via ORAL
  Filled 2020-01-26 (×7): qty 1

## 2020-01-26 MED ORDER — ADULT MULTIVITAMIN W/MINERALS CH
1.0000 | ORAL_TABLET | Freq: Every day | ORAL | Status: DC
  Administered 2020-01-27 – 2020-02-01 (×6): 1 via ORAL
  Filled 2020-01-26 (×6): qty 1

## 2020-01-26 MED ORDER — HYDRALAZINE HCL 20 MG/ML IJ SOLN: 5.0000 mg | INTRAMUSCULAR | Status: DC | PRN

## 2020-01-26 MED ORDER — ACETAMINOPHEN 325 MG PO TABS
650.0000 mg | ORAL_TABLET | Freq: Four times a day (QID) | ORAL | Status: DC | PRN
  Administered 2020-01-30: 650 mg via ORAL
  Filled 2020-01-26: qty 2

## 2020-01-26 MED ORDER — ASCORBIC ACID 500 MG PO TABS
1000.0000 mg | ORAL_TABLET | Freq: Every day | ORAL | Status: DC
  Administered 2020-01-27 – 2020-02-01 (×6): 1000 mg via ORAL
  Filled 2020-01-26 (×6): qty 2

## 2020-01-26 MED ORDER — OMEGA-3-ACID ETHYL ESTERS 1 G PO CAPS
1.0000 g | ORAL_CAPSULE | Freq: Two times a day (BID) | ORAL | Status: DC
  Administered 2020-01-26 – 2020-02-01 (×11): 1 g via ORAL
  Filled 2020-01-26 (×12): qty 1

## 2020-01-26 MED ORDER — CALCIUM CARBONATE-VITAMIN D 500-200 MG-UNIT PO TABS
1.0000 | ORAL_TABLET | Freq: Every day | ORAL | Status: DC
  Administered 2020-01-27 – 2020-02-01 (×6): 1 via ORAL
  Filled 2020-01-26 (×6): qty 1

## 2020-01-26 MED ORDER — HEPARIN SODIUM (PORCINE) 5000 UNIT/ML IJ SOLN
5000.0000 [IU] | Freq: Three times a day (TID) | INTRAMUSCULAR | Status: DC
  Administered 2020-01-26 – 2020-01-31 (×11): 5000 [IU] via SUBCUTANEOUS
  Filled 2020-01-26 (×12): qty 1

## 2020-01-26 MED ORDER — SODIUM CHLORIDE 0.9 % IV SOLN: INTRAVENOUS | Status: DC

## 2020-01-26 MED ORDER — LEVOTHYROXINE SODIUM 88 MCG PO TABS
88.0000 ug | ORAL_TABLET | Freq: Every day | ORAL | Status: DC
  Administered 2020-01-27 – 2020-02-01 (×6): 88 ug via ORAL
  Filled 2020-01-26 (×8): qty 1

## 2020-01-26 MED ORDER — SODIUM CHLORIDE 0.9 % IV BOLUS
1000.0000 mL | Freq: Once | INTRAVENOUS | Status: AC
  Administered 2020-01-26: 14:00:00 1000 mL via INTRAVENOUS

## 2020-01-26 MED ORDER — PANTOPRAZOLE SODIUM 40 MG PO TBEC
40.0000 mg | DELAYED_RELEASE_TABLET | Freq: Every day | ORAL | Status: DC
  Administered 2020-01-26 – 2020-02-02 (×8): 40 mg via ORAL
  Filled 2020-01-26 (×8): qty 1

## 2020-01-26 MED ORDER — ASPIRIN EC 81 MG PO TBEC: 81.0000 mg | DELAYED_RELEASE_TABLET | Freq: Every day | ORAL | Status: DC

## 2020-01-26 MED ORDER — B COMPLEX-C PO TABS
1.0000 | ORAL_TABLET | Freq: Every day | ORAL | Status: DC
  Administered 2020-01-27 – 2020-02-01 (×6): 1 via ORAL
  Filled 2020-01-26 (×7): qty 1

## 2020-01-26 MED ORDER — ONDANSETRON HCL 4 MG/2ML IJ SOLN
4.0000 mg | Freq: Three times a day (TID) | INTRAMUSCULAR | Status: DC | PRN
  Administered 2020-01-27 – 2020-01-28 (×4): 4 mg via INTRAVENOUS
  Filled 2020-01-26 (×5): qty 2

## 2020-01-26 MED ORDER — ROSUVASTATIN CALCIUM 10 MG PO TABS
20.0000 mg | ORAL_TABLET | Freq: Every day | ORAL | Status: DC
  Administered 2020-01-27: 10:00:00 20 mg via ORAL
  Filled 2020-01-26: qty 2

## 2020-01-26 MED ORDER — VITAMIN E 180 MG (400 UNIT) PO CAPS
400.0000 [IU] | ORAL_CAPSULE | Freq: Every day | ORAL | Status: DC
  Administered 2020-01-27 – 2020-02-01 (×6): 400 [IU] via ORAL
  Filled 2020-01-26 (×7): qty 1

## 2020-01-26 NOTE — ED Triage Notes (Signed)
Pt reports she had blood work at her MD officer yesterday and he called her today and told her to come to the ED for an elevated WBC and kidney function results. Pt reports has been feeling weak for several months now

## 2020-01-26 NOTE — H&P (Addendum)
History and Physical    Michelle Lambert:[id number] DOB: Jan 27, 1946 DOA: 01/26/2020  Referring MD/NP/PA:   PCP: [place] Pink, MD   Patient coming from:  The patient is coming from home.  At baseline, pt is independent for most of ADL.        Chief Complaint: abnormal lab and generalized weakness.  HPI: Michelle Lambert is a 74 y.o. female with medical history significant of hypertension, hyperlipidemia, hypothyroidism, depression, breast cancer (s/p of mastectomy and chemotherapy 1989), who presents with abnormal lab and generalized weakness.  Patient states that she has been having generalized weakness, fatigue, decreased appetite and oral intake for several months, which has been worsening progressively.  Patient was seen by PCP yesterday, and had blood work done at her primary doctor's office. She was told that she needed to come to the ER for her elevated WBCs and kidney function. She states that she had laryngeal ulcer, and was treated by ENT in March.  Because of this issue, she has been having poor appetite and decreased oral intake.  She has nausea, no vomiting, diarrhea or abdominal pain currently.  Patient does not have chest pain, shortness breath, fever or chills.  She has mild chronic cough, which has not changed.  No symptoms of UTI or unilateral weakness.  ED Course: pt was found to have AKI with creatinine 8.01, BUN 67 (patient had a creatinine 0.8 on 08/17/2019, 4.5 on 01/25/2020).  WBC 11.4, pending COVID-19 PCR, temperature normal, blood pressure 105/54, heart rate 79, RR 16, oxygen saturation 96% on room air.  Chest x-ray negative.  Patient is placed on MedSurg for obs.  Nephrology, Dr. Holley Raring is consulted.   Review of Systems:   General: no fevers, chills, no body weight gain, has poor appetite, has fatigue HEENT: no blurry vision, hearing changes or sore throat Respiratory: no dyspnea, coughing, wheezing CV: no chest pain, no palpitations GI: has nausea,no  vomiting,  abdominal pain, diarrhea, constipation GU: no dysuria, burning on urination, increased urinary frequency, hematuria  Ext: no leg edema Neuro: no unilateral weakness, numbness, or tingling, no vision change or hearing loss Skin: no rash, no skin tear. MSK: No muscle spasm, no deformity, no limitation of range of movement in spin Heme: No easy bruising.  Travel history: No recent long distant travel.  Allergy: No Known Allergies  Past Medical History:  Diagnosis Date  . Allergy    seasonal  . Cancer ([place])    left/chemo 1989  . Depression   . Hyperlipidemia   . Hypertension   . Hypothyroidism   . Rheumatic fever   . Thyroid disease     Past Surgical History:  Procedure Laterality Date  . BREAST BIOPSY Right 06/13/2006   benign  . BREAST EXCISIONAL BIOPSY Right 1982   benign  . BREAST IMPLANT REMOVAL     left with replacement implant 2011  . BREAST RECONSTRUCTION     [phone] left  . COLONOSCOPY    . COLONOSCOPY WITH PROPOFOL N/A 06/06/2018   Procedure: COLONOSCOPY WITH PROPOFOL;  Surgeon: Lollie Sails, MD;  Location: [facility] ENDOSCOPY;  Service: Endoscopy;  Laterality: N/A;  . MASTECTOMY Left    left 1989  . POLYPECTOMY      Social History:  reports that she has quit smoking. She has never used smokeless tobacco. She reports that she does not drink alcohol or use drugs.  Family History:  Family History  Problem Relation Age of Onset  . Heart disease Mother   .  Emphysema Father      Prior to Admission medications   Medication Sig Start Date End Date Taking? Authorizing Provider  alendronate-cholecalciferol (FOSAMAX PLUS D) 70-2800 MG-UNIT per tablet Take 1 tablet by mouth every 7 (seven) days. Take with a full glass of water on an empty stomach.     [provider]  Ascorbic Acid (VITAMIN C) 1000 MG tablet Take 1,000 mg by mouth daily.      [provider]  aspirin 81 MG EC tablet Take 81 mg by mouth daily.      [provider]  b  complex vitamins tablet Take 1 tablet by mouth daily.    [provider]  calcium-vitamin D (OSCAL-500) 500-400 MG-UNIT per tablet Take 1 tablet by mouth daily.      [provider]  candesartan-hydrochlorothiazide (ATACAND HCT) 16-12.5 MG tablet Take 1 tablet by mouth daily.    [provider]  levothyroxine (SYNTHROID, LEVOTHROID) 100 MCG tablet Take 100 mcg by mouth daily.      [provider]  Multiple Vitamins-Minerals (MULTIVITAMIN WITH MINERALS) tablet Take 1 tablet by mouth daily.      [provider]  omega-3 acid ethyl esters (LOVAZA) 1 G capsule Take 1 g by mouth daily.      [provider]  peg 3350 powder (MOVIPREP) 100 G SOLR MOVI PREP take as directed 12/25/10   Lafayette Dragon, MD  rosuvastatin (CRESTOR) 20 MG tablet Take 20 mg by mouth daily.      [provider]  vitamin E 400 UNIT capsule Take 400 Units by mouth daily.      [provider]    Physical Exam: Vitals:   01/26/20 1730 01/26/20 1800 01/26/20 1830 01/26/20 1900  BP: (!) 119/56 (!) 124/51 127/64 123/63  Pulse: 69 67 65 65  Resp:      Temp:      TempSrc:      SpO2: 98% 99% 97% 98%  Weight:      Height:       General: Not in acute distress HEENT:       Eyes: PERRL, EOMI, no scleral icterus.       ENT: No discharge from the ears and nose, no pharynx injection, no tonsillar enlargement.        Neck: No JVD, no bruit, no mass felt. Heme: No neck lymph node enlargement. Cardiac: S1/S2, RRR, No murmurs, No gallops or rubs. Respiratory:  No rales, wheezing, rhonchi or rubs. GI: Soft, nondistended, nontender, no rebound pain, no organomegaly, BS present. GU: No hematuria Ext: No pitting leg edema bilaterally. 2+DP/PT pulse bilaterally. Musculoskeletal: No joint deformities, No joint redness or warmth, no limitation of ROM in spin. Skin: No rashes.  Neuro: Alert, oriented X3, cranial nerves II-XII grossly intact, moves all extremities  normally.  Psych: Patient is not psychotic, no suicidal or hemocidal ideation.  Labs on Admission: I have personally reviewed following labs and imaging studies  CBC: Recent Labs  Lab 01/26/20 1203  WBC 11.4*  HGB 10.3*  HCT 29.4*  MCV 85.7  PLT 794   Basic Metabolic Panel: Recent Labs  Lab 01/26/20 1203  NA 135  K 3.8  CL 103  CO2 20*  GLUCOSE 106*  BUN 67*  CREATININE 8.01*  CALCIUM 9.1   GFR: Estimated Creatinine Clearance: 5.5 mL/min (A) (by C-G formula based on SCr of 8.01 mg/dL (H)). Liver Function Tests: Recent Labs  Lab 01/26/20 1203  AST 15  ALT 11  ALKPHOS 55  BILITOT 1.0  PROT 7.8  ALBUMIN 3.4*   No results for input(s): LIPASE, AMYLASE in the last 168 hours. No results for input(s): AMMONIA in the last 168 hours. Coagulation Profile: No results for input(s): INR, PROTIME in the last 168 hours. Cardiac Enzymes: No results for input(s): CKTOTAL, CKMB, CKMBINDEX, TROPONINI in the last 168 hours. BNP (last 3 results) No results for input(s): PROBNP in the last 8760 hours. HbA1C: No results for input(s): HGBA1C in the last 72 hours. CBG: No results for input(s): GLUCAP in the last 168 hours. Lipid Profile: No results for input(s): CHOL, HDL, LDLCALC, TRIG, CHOLHDL, LDLDIRECT in the last 72 hours. Thyroid Function Tests: No results for input(s): TSH, T4TOTAL, FREET4, T3FREE, THYROIDAB in the last 72 hours. Anemia Panel: No results for input(s): VITAMINB12, FOLATE, FERRITIN, TIBC, IRON, RETICCTPCT in the last 72 hours. Urine analysis:    Component Value Date/Time   COLORURINE YELLOW (A) 01/26/2020 1203   APPEARANCEUR CLOUDY (A) 01/26/2020 1203   LABSPEC 1.008 01/26/2020 1203   PHURINE 7.0 01/26/2020 1203   GLUCOSEU NEGATIVE 01/26/2020 1203   HGBUR SMALL (A) 01/26/2020 1203   BILIRUBINUR NEGATIVE 01/26/2020 1203   KETONESUR NEGATIVE 01/26/2020 1203   PROTEINUR 100 (A) 01/26/2020 1203   NITRITE NEGATIVE 01/26/2020 1203   LEUKOCYTESUR LARGE  (A) 01/26/2020 1203   Sepsis Labs: @LABRCNTIP (procalcitonin:4,lacticidven:4) ) Recent Results (from the past 240 hour(s))  SARS Coronavirus 2 by RT PCR (hospital order, performed in [place] hospital lab) Nasopharyngeal Nasopharyngeal Swab     Status: None   Collection Time: 01/26/20  3:54 PM   Specimen: Nasopharyngeal Swab  Result Value Ref Range Status   SARS Coronavirus 2 NEGATIVE NEGATIVE Final    Comment: (NOTE) SARS-CoV-2 target nucleic acids are NOT DETECTED. The SARS-CoV-2 RNA is generally detectable in upper and lower respiratory specimens during the acute phase of infection. The lowest concentration of SARS-CoV-2 viral copies this assay can detect is 250 copies / mL. A negative result does not preclude SARS-CoV-2 infection and should not be used as the sole basis for treatment or other patient management decisions.  A negative result may occur with improper specimen collection / handling, submission of specimen other than nasopharyngeal swab, presence of viral mutation(s) within the areas targeted by this assay, and inadequate number of viral copies (<250 copies / mL). A negative result must be combined with clinical observations, patient history, and epidemiological information. Fact Sheet for Patients:   StrictlyIdeas.no Fact Sheet for Healthcare Providers: BankingDealers.co.za This test is not yet approved or cleared  by the [place] FDA and has been authorized for detection and/or diagnosis of SARS-CoV-2 by FDA under an Emergency Use Authorization (EUA).  This EUA will remain in effect (meaning this test can be used) for the duration of the COVID-19 declaration under Section 564(b)(1) of the Act, 21 U.S.C. section 360bbb-3(b)(1), unless the authorization is terminated or revoked sooner. Performed at [facility], [address] [place],  [address]      Radiological Exams on Admission: DG  Chest 2 View  Result Date: 01/26/2020 CLINICAL DATA:  Shortness of breath EXAM: CHEST - 2 VIEW COMPARISON:  By report from 01/25/2020 FINDINGS: Cardiac shadow is within normal limits. Left breast implant is noted. Left axillary node dissection is seen. The lungs are clear bilaterally. No focal infiltrate or effusion is seen. No acute bony abnormality is noted. IMPRESSION: No acute abnormality noted. Electronically Signed   By: Inez Catalina M.D.   On:  01/26/2020 15:13   US RENAL  Result Date: 01/26/2020 CLINICAL DATA:  Acute kidney injury EXAM: RENAL / URINARY TRACT ULTRASOUND COMPLETE COMPARISON:  None. FINDINGS: Right Kidney: Renal measurements: 12.2 x 6.2 x 5.9 cm = volume: 237 mL. Diffusely increased echotexture. No mass or hydronephrosis. Left Kidney: Renal measurements: 11.9 x 6.0 x 5.5 cm = volume: 203 mL. Diffusely increased echotexture. No mass or hydronephrosis. Bladder: Appears normal for degree of bladder distention. Other: None. IMPRESSION: No acute findings.  No hydronephrosis. Mildly increased echotexture throughout the kidneys suggesting chronic medical renal disease. Electronically Signed   By: Rolm Baptise M.D.   On: 01/26/2020 18:47     EKG: Independently reviewed.  Sinus rhythm, QTC 443, LAE, nonspecific T wave change   Assessment/Plan Principal Problem:   AKI (acute kidney injury) ([place]) Active Problems:   Hypertension   Hyperlipidemia   Hypothyroidism   Leukocytosis   GERD (gastroesophageal reflux disease)   AKI (acute kidney injury) ([place]): Etiology is not clear. Likely due to dehydration and continuation of ARB, diuretics.  Dr. Holley Raring will follow nephrology is consulted.  -place on MedSurg for obs. - IVF: 1 L normal saline, followed by 100 cc/h - Follow up renal function by BMP - Avoid using renal toxic medications, hypotension and contrast dye (or carefully use) - Check FeUrea - Hold candesartan-HCTZ - f/u renal US  Hypertension -IV hydralazine as  needed -Hold candesartan-HCTZ -Start amlodipine 5 mg daily  Hyperlipidemia -Crestor  Hypothyroidism -Synthroid  Leukocytosis: No source of infection identified.  Likely reactive.  WBC 11.4. -Follow-up with CBC  GERD: -Protonix     DVT ppx: SQ Heparin     Code Status: Full code Family Communication: not done, no family member is at bed side.        Disposition Plan:  Anticipate discharge back to previous home environment Consults called:  Dr. Holley Raring of renal is consulted Admission status: Med-surg bed for obs     Status is: Observation  The patient remains OBS appropriate and will d/c before 2 midnights.  Dispo: The patient is from: Home              Anticipated d/c is to: Home              Anticipated d/c date is: 1 day              Patient currently is not medically stable to d/c.          Date of Service 01/26/2020    Ivor Costa [facility]   If 7PM-7AM, please contact night-coverage [email or web address] 01/26/2020, 7:39 PM

## 2020-01-26 NOTE — ED Provider Notes (Signed)
[facility] Emergency Department Provider Note  ____________________________________________   First MD Initiated Contact with Patient 01/26/20 1329     (approximate)  I have reviewed the triage vital signs and the nursing notes.   HISTORY  Chief Complaint Abnormal Lab    HPI STEPFANIE Lambert is a 74 y.o. female with depression, hypertension, hyperlipidemia, hypothyroid who comes in with abnormal lab.  Patient had blood work done at her primary doctor's office and was told that she needed to come to the ER for her elevated WBCs and kidney function.  Patient's last check on 08/17/2019 was 0.8 kidney function and went up to 4.5 on 01/25/19/2021.  Her white count was 7.1 on 08/17/2019 went up to 15.4 on 01/25/2020  Patient states that she was seen an ENT doctor and was diagnosed with laryngeal ulcer.  She states that she was on a course of acid reducer and steroids and not seem to have gotten better but over the past 3 months she is not been able to eat anything because she just feels nauseous after eating and occasionally will vomit.  Denies any abdominal pain at this time.  States that she just felt a lot of fatigue that is severe, constant, nothing makes it better, nothing makes it worse.  She denies having this previously.  She states that she still able to drink water and still urinates.  She does report some occasional shortness of breath as well but denies any currently.  Had a chest x-ray yesterday but the results are not in.  Patient states she has lost 17 pounds in 3 months.          Past Medical History:  Diagnosis Date  . Allergy    seasonal  . Cancer ([place])    left/chemo 1989  . Depression   . Hyperlipidemia   . Hypertension   . Hypothyroidism   . Rheumatic fever   . Thyroid disease     There are no problems to display for this patient.   Past Surgical History:  Procedure Laterality Date  . BREAST BIOPSY Right 06/13/2006   benign  .  BREAST EXCISIONAL BIOPSY Right 1982   benign  . BREAST IMPLANT REMOVAL     left with replacement implant 2011  . BREAST RECONSTRUCTION     [phone] left  . COLONOSCOPY    . COLONOSCOPY WITH PROPOFOL N/A 06/06/2018   Procedure: COLONOSCOPY WITH PROPOFOL;  Surgeon: Lollie Sails, MD;  Location: [facility] ENDOSCOPY;  Service: Endoscopy;  Laterality: N/A;  . MASTECTOMY Left    left 1989  . POLYPECTOMY      Prior to Admission medications   Medication Sig Start Date End Date Taking? Authorizing Provider  alendronate-cholecalciferol (FOSAMAX PLUS D) 70-2800 MG-UNIT per tablet Take 1 tablet by mouth every 7 (seven) days. Take with a full glass of water on an empty stomach.     [provider]  Ascorbic Acid (VITAMIN C) 1000 MG tablet Take 1,000 mg by mouth daily.      [provider]  aspirin 81 MG EC tablet Take 81 mg by mouth daily.      [provider]  b complex vitamins tablet Take 1 tablet by mouth daily.    [provider]  calcium-vitamin D (OSCAL-500) 500-400 MG-UNIT per tablet Take 1 tablet by mouth daily.      [provider]  candesartan-hydrochlorothiazide (ATACAND HCT) 16-12.5 MG tablet Take 1 tablet by mouth daily.    [provider]  levothyroxine (SYNTHROID, LEVOTHROID) 100 MCG tablet Take 100 mcg by mouth daily.      [provider]  Multiple Vitamins-Minerals (MULTIVITAMIN WITH MINERALS) tablet Take 1 tablet by mouth daily.      [provider]  omega-3 acid ethyl esters (LOVAZA) 1 G capsule Take 1 g by mouth daily.      [provider]  peg 3350 powder (MOVIPREP) 100 G SOLR MOVI PREP take as directed 12/25/10   Lafayette Dragon, MD  rosuvastatin (CRESTOR) 20 MG tablet Take 20 mg by mouth daily.      [provider]  vitamin E 400 UNIT capsule Take 400 Units by mouth daily.      [provider]    Allergies Patient has no known allergies.  Family History  Problem Relation Age  of Onset  . Heart disease Mother   . Emphysema Father     Social History Social History   Tobacco Use  . Smoking status: Former Research scientist (life sciences)  . Smokeless tobacco: Never Used  Substance Use Topics  . Alcohol use: No  . Drug use: No      Review of Systems Constitutional: No fever/chills, fatigue Eyes: No visual changes. ENT: No sore throat. Cardiovascular: Denies chest pain. Respiratory: Positive shortness of breath Gastrointestinal: No abdominal pain.  Positive nausea after eating, no vomiting.  No diarrhea.  No constipation. Genitourinary: Negative for dysuria. Musculoskeletal: Negative for back pain. Skin: Negative for rash. Neurological: Negative for headaches, focal weakness or numbness. All other ROS negative ____________________________________________   PHYSICAL EXAM:  VITAL SIGNS: ED Triage Vitals  Enc Vitals Group     BP 01/26/20 1200 (!) 105/54     Pulse Rate 01/26/20 1200 79     Resp 01/26/20 1200 16     Temp 01/26/20 1200 98.1 F (36.7 C)     Temp Source 01/26/20 1200 Oral     SpO2 01/26/20 1200 96 %     Weight 01/26/20 1138 145 lb (65.8 kg)     Height 01/26/20 1138 5\' 1"  (1.549 m)     Head Circumference --      Peak Flow --      Pain Score 01/26/20 1138 0     Pain Loc --      Pain Edu? --      Excl. in [place]? --     Constitutional: Alert and oriented. Well appearing and in no acute distress. Eyes: Conjunctivae are normal. EOMI. Head: Atraumatic. Nose: No congestion/rhinnorhea. Mouth/Throat: Mucous membranes are moist.   Neck: No stridor. Trachea Midline. FROM Cardiovascular: Normal rate, regular rhythm. Grossly normal heart sounds.  Good peripheral circulation. Respiratory: Normal respiratory effort.  No retractions. Lungs CTAB. Gastrointestinal: Soft and nontender. No distention. No abdominal bruits.  Musculoskeletal: No lower extremity tenderness nor edema.  No joint effusions. Neurologic:  Normal speech and language. No gross focal neurologic  deficits are appreciated.  Psychiatric: Mood and affect are normal. Speech and behavior are normal. GU: Deferred   ____________________________________________   LABS (all labs ordered are listed, but only abnormal results are displayed)  Labs Reviewed  CBC - Abnormal; Notable for the following components:      Result Value   WBC 11.4 (*)    RBC 3.43 (*)    Hemoglobin 10.3 (*)    HCT 29.4 (*)    All other components within normal limits  COMPREHENSIVE METABOLIC PANEL - Abnormal; Notable for the following components:   CO2 20 (*)  Glucose, Bld 106 (*)    BUN 67 (*)    Creatinine, Ser 8.01 (*)    Albumin 3.4 (*)    GFR calc non Af Amer 5 (*)    GFR calc Af Amer 5 (*)    All other components within normal limits  URINALYSIS, COMPLETE (UACMP) WITH MICROSCOPIC   ____________________________________________   ED ECG REPORT I, Vanessa Wittmann, the attending physician, personally viewed and interpreted this ECG.  EKG is normal sinus rate of 81, no ST elevation, no T wave inversions, normal intervals ____________________________________________  RADIOLOGY Robert Bellow, personally viewed and evaluated these images (plain radiographs) as part of my medical decision making, as well as reviewing the written report by the radiologist.  ED MD interpretation: No new fluid overload  Official radiology report(s): DG Chest 2 View  Result Date: 01/26/2020 CLINICAL DATA:  Shortness of breath EXAM: CHEST - 2 VIEW COMPARISON:  By report from 01/25/2020 FINDINGS: Cardiac shadow is within normal limits. Left breast implant is noted. Left axillary node dissection is seen. The lungs are clear bilaterally. No focal infiltrate or effusion is seen. No acute bony abnormality is noted. IMPRESSION: No acute abnormality noted. Electronically Signed   By: Inez Catalina M.D.   On: 01/26/2020 15:13    ____________________________________________   PROCEDURES  Procedure(s) performed (including  Critical Care):  Procedures   ____________________________________________   INITIAL IMPRESSION / ASSESSMENT AND PLAN / ED COURSE  Michelle Lambert was evaluated in Emergency Department on 01/26/2020 for the symptoms described in the history of present illness. She was evaluated in the context of the global COVID-19 pandemic, which necessitated consideration that the patient might be at risk for infection with the SARS-CoV-2 virus that causes COVID-19. Institutional protocols and algorithms that pertain to the evaluation of patients at risk for COVID-19 are in a state of rapid change based on information released by regulatory bodies including the CDC and federal and state organizations. These policies and algorithms were followed during the patient's care in the ED.     Patient's labs are notable for a hemoglobin of 10.3, white count of 11.4 but patient otherwise does not meet any sepsis criteria denies any symptoms of infection.  Her kidney function is 8.01 with a BUN of 67 but no abnormalities in her potassium requirements for emergent dialysis.  Given patient not eating I suspect this could be secondary to dehydration.  We will give 1 L of fluids.  Make sure that she is not retaining too suggesting a Foley.  Will get chest x-ray just make sure no signs of significant fluid overload.  Will discuss with hospital team for admission.       ____________________________________________   FINAL CLINICAL IMPRESSION(S) / ED DIAGNOSES   Final diagnoses:  AKI (acute kidney injury) ([place])      MEDICATIONS GIVEN DURING THIS VISIT:  Medications  ondansetron (ZOFRAN) injection 4 mg (has no administration in time range)  hydrALAZINE (APRESOLINE) injection 5 mg (has no administration in time range)  acetaminophen (TYLENOL) tablet 650 mg (has no administration in time range)  sodium chloride 0.9 % bolus 1,000 mL (1,000 mLs Intravenous New Bag/Given 01/26/20 1359)     ED Discharge Orders    None         Note:  This document was prepared using Dragon voice recognition software and may include unintentional dictation errors.   Vanessa [place]le[place], MD 01/26/20 1538

## 2020-01-27 ENCOUNTER — Encounter: Payer: Self-pay | Admitting: Internal Medicine

## 2020-01-27 ENCOUNTER — Encounter
Admission: EM | Disposition: A | Discharge status: Home / Self Care | Payer: Self-pay | Source: Home / Self Care | Attending: Family Medicine

## 2020-01-27 DIAGNOSIS — N2581 Secondary hyperparathyroidism of renal origin: Secondary | ICD-10-CM | POA: Diagnosis present

## 2020-01-27 DIAGNOSIS — N179 Acute kidney failure, unspecified: Secondary | ICD-10-CM | POA: Diagnosis present

## 2020-01-27 DIAGNOSIS — R809 Proteinuria, unspecified: Secondary | ICD-10-CM | POA: Diagnosis present

## 2020-01-27 DIAGNOSIS — Z8249 Family history of ischemic heart disease and other diseases of the circulatory system: Secondary | ICD-10-CM | POA: Diagnosis not present

## 2020-01-27 DIAGNOSIS — I10 Essential (primary) hypertension: Secondary | ICD-10-CM | POA: Diagnosis present

## 2020-01-27 DIAGNOSIS — R112 Nausea with vomiting, unspecified: Secondary | ICD-10-CM | POA: Diagnosis not present

## 2020-01-27 DIAGNOSIS — Z79890 Hormone replacement therapy (postmenopausal): Secondary | ICD-10-CM | POA: Diagnosis not present

## 2020-01-27 DIAGNOSIS — E440 Moderate protein-calorie malnutrition: Secondary | ICD-10-CM | POA: Diagnosis present

## 2020-01-27 DIAGNOSIS — Z20822 Contact with and (suspected) exposure to covid-19: Secondary | ICD-10-CM | POA: Diagnosis present

## 2020-01-27 DIAGNOSIS — N185 Chronic kidney disease, stage 5: Secondary | ICD-10-CM | POA: Diagnosis not present

## 2020-01-27 DIAGNOSIS — Z9012 Acquired absence of left breast and nipple: Secondary | ICD-10-CM | POA: Diagnosis not present

## 2020-01-27 DIAGNOSIS — Z853 Personal history of malignant neoplasm of breast: Secondary | ICD-10-CM | POA: Diagnosis not present

## 2020-01-27 DIAGNOSIS — R3129 Other microscopic hematuria: Secondary | ICD-10-CM | POA: Diagnosis present

## 2020-01-27 DIAGNOSIS — E039 Hypothyroidism, unspecified: Secondary | ICD-10-CM | POA: Diagnosis present

## 2020-01-27 DIAGNOSIS — Z825 Family history of asthma and other chronic lower respiratory diseases: Secondary | ICD-10-CM | POA: Diagnosis not present

## 2020-01-27 DIAGNOSIS — Z87891 Personal history of nicotine dependence: Secondary | ICD-10-CM | POA: Diagnosis not present

## 2020-01-27 DIAGNOSIS — E785 Hyperlipidemia, unspecified: Secondary | ICD-10-CM | POA: Diagnosis present

## 2020-01-27 DIAGNOSIS — Z9221 Personal history of antineoplastic chemotherapy: Secondary | ICD-10-CM | POA: Diagnosis not present

## 2020-01-27 DIAGNOSIS — Z7982 Long term (current) use of aspirin: Secondary | ICD-10-CM | POA: Diagnosis not present

## 2020-01-27 DIAGNOSIS — E872 Acidosis: Secondary | ICD-10-CM | POA: Diagnosis present

## 2020-01-27 DIAGNOSIS — N170 Acute kidney failure with tubular necrosis: Secondary | ICD-10-CM | POA: Diagnosis present

## 2020-01-27 DIAGNOSIS — E860 Dehydration: Secondary | ICD-10-CM | POA: Diagnosis present

## 2020-01-27 DIAGNOSIS — E876 Hypokalemia: Secondary | ICD-10-CM | POA: Diagnosis not present

## 2020-01-27 DIAGNOSIS — D649 Anemia, unspecified: Secondary | ICD-10-CM | POA: Diagnosis present

## 2020-01-27 DIAGNOSIS — K219 Gastro-esophageal reflux disease without esophagitis: Secondary | ICD-10-CM | POA: Diagnosis present

## 2020-01-27 HISTORY — PX: DIALYSIS/PERMA CATHETER INSERTION: CATH118288

## 2020-01-27 LAB — CBC
HCT: 25.9 % — ABNORMAL LOW (ref 36.0–46.0)
Hemoglobin: 8.6 g/dL — ABNORMAL LOW (ref 12.0–15.0)
MCH: 29.6 pg (ref 26.0–34.0)
MCHC: 33.2 g/dL (ref 30.0–36.0)
MCV: 89 fL (ref 80.0–100.0)
Platelets: 253 10*3/uL (ref 150–400)
RBC: 2.91 MIL/uL — ABNORMAL LOW (ref 3.87–5.11)
RDW: 12.1 % (ref 11.5–15.5)
WBC: 8.7 10*3/uL (ref 4.0–10.5)
nRBC: 0 % (ref 0.0–0.2)

## 2020-01-27 LAB — BASIC METABOLIC PANEL
Anion gap: 11 (ref 5–15)
BUN: 69 mg/dL — ABNORMAL HIGH (ref 8–23)
CO2: 17 mmol/L — ABNORMAL LOW (ref 22–32)
Calcium: 8.3 mg/dL — ABNORMAL LOW (ref 8.9–10.3)
Chloride: 108 mmol/L (ref 98–111)
Creatinine, Ser: 8.04 mg/dL — ABNORMAL HIGH (ref 0.44–1.00)
GFR calc Af Amer: 5 mL/min — ABNORMAL LOW (ref >60)
GFR calc non Af Amer: 5 mL/min — ABNORMAL LOW (ref >60)
Glucose, Bld: 78 mg/dL (ref 70–99)
Potassium: 3.5 mmol/L (ref 3.5–5.1)
Sodium: 136 mmol/L (ref 135–145)

## 2020-01-27 LAB — GLUCOSE, CAPILLARY: Glucose-Capillary: 73 mg/dL (ref 70–99)

## 2020-01-27 SURGERY — DIALYSIS/PERMA CATHETER INSERTION
Anesthesia: LOCAL

## 2020-01-27 MED ORDER — SALINE SPRAY 0.65 % NA SOLN
1.0000 | NASAL | Status: DC | PRN
  Filled 2020-01-27 (×2): qty 44

## 2020-01-27 MED ORDER — SODIUM CHLORIDE 0.9 % IV BOLUS: 500.0000 mL | Freq: Once | INTRAVENOUS | Status: DC

## 2020-01-27 MED ORDER — CHLORHEXIDINE GLUCONATE CLOTH 2 % EX PADS
6.0000 | MEDICATED_PAD | Freq: Every day | CUTANEOUS | Status: DC
  Administered 2020-01-29 – 2020-01-30 (×2): 6 via TOPICAL

## 2020-01-27 MED ORDER — OXYMETAZOLINE HCL 0.05 % NA SOLN
1.0000 | Freq: Four times a day (QID) | NASAL | Status: DC | PRN
  Administered 2020-01-28 – 2020-01-30 (×2): 1 via NASAL
  Filled 2020-01-27 (×3): qty 15

## 2020-01-27 MED ORDER — CHLORHEXIDINE GLUCONATE CLOTH 2 % EX PADS
6.0000 | MEDICATED_PAD | Freq: Every day | CUTANEOUS | Status: DC
  Administered 2020-01-28 – 2020-01-30 (×3): 6 via TOPICAL

## 2020-01-27 MED ORDER — BOOST / RESOURCE BREEZE PO LIQD CUSTOM
1.0000 | Freq: Three times a day (TID) | ORAL | Status: DC
  Administered 2020-01-27: 1 via ORAL

## 2020-01-27 SURGICAL SUPPLY — 1 items: KIT DIALYSIS CATH TRI 30X13 (CATHETERS) ×1 IMPLANT

## 2020-01-27 NOTE — Consult Note (Signed)
[facility] Vascular Consult Note  MRN : [id number]  Michelle Lambert is a 74 y.o. (Apr 10, 1946) female who presents with chief complaint of  Chief Complaint  Patient presents with  . Abnormal Lab   History of Present Illness:  Michelle Lambert is a 74 y.o. female with medical history significant of hypertension, hyperlipidemia, hypothyroidism, depression, breast cancer (s/p of mastectomy and chemotherapy 1989), who presents with abnormal lab and generalized weakness.   Patient is admitted to the hospital and has been diagnosed with acute renal failure. The nephrology service has decided to initiate dialysis at this time, and we are asked to place a temporary dialysis catheter for immediate dialysis use.    Vascular surgery was consulted by Dr. Holley Raring for a temporary dialysis catheter insertion.  Current Facility-Administered Medications  Medication Dose Route Frequency Provider Last Rate Last Admin  . 0.9 %  sodium chloride infusion   Intravenous Continuous Ivor Costa, MD 100 mL/hr at 01/27/20 1258 New Bag at 01/27/20 1258  . [MAR Hold] acetaminophen (TYLENOL) tablet 650 mg  650 mg Oral Q6H PRN Ivor Costa, MD      . Doug Sou Hold] amLODipine (NORVASC) tablet 5 mg  5 mg Oral Daily Ivor Costa, MD   5 mg at 01/27/20 1007  . [MAR Hold] ascorbic acid (VITAMIN C) tablet 1,000 mg  1,000 mg Oral Daily Ivor Costa, MD   1,000 mg at 01/27/20 1008  . [MAR Hold] aspirin EC tablet 81 mg  81 mg Oral Daily Gerald Dexter, RPH   81 mg at 01/27/20 1007  . [MAR Hold] B-complex with vitamin C tablet 1 tablet  1 tablet Oral Daily Ivor Costa, MD   1 tablet at 01/27/20 1008  . [MAR Hold] calcium-vitamin D (OSCAL WITH D) 500-200 MG-UNIT per tablet 1 tablet  1 tablet Oral Daily Ivor Costa, MD   1 tablet at 01/27/20 1008  . [MAR Hold] feeding supplement (BOOST / RESOURCE BREEZE) liquid 1 Container  1 Container Oral TID BM Danford, Suann Larry, MD      . Doug Sou Hold] heparin injection 5,000  Units  5,000 Units Subcutaneous Q8H Ivor Costa, MD   5,000 Units at 01/27/20 0544  . [MAR Hold] hydrALAZINE (APRESOLINE) injection 5 mg  5 mg Intravenous Q2H PRN Ivor Costa, MD      . Doug Sou Hold] levothyroxine (SYNTHROID) tablet 88 mcg  88 mcg Oral Daily Ivor Costa, MD   88 mcg at 01/27/20 0545  . [MAR Hold] montelukast (SINGULAIR) tablet 10 mg  10 mg Oral Daily Ivor Costa, MD   10 mg at 01/27/20 1007  . [MAR Hold] multivitamin with minerals tablet 1 tablet  1 tablet Oral Daily Ivor Costa, MD   1 tablet at 01/27/20 1007  . [MAR Hold] omega-3 acid ethyl esters (LOVAZA) capsule 1 g  1 g Oral BID Ivor Costa, MD   1 g at 01/27/20 1007  . [MAR Hold] ondansetron (ZOFRAN) injection 4 mg  4 mg Intravenous Q8H PRN Ivor Costa, MD   4 mg at 01/27/20 0100  . [MAR Hold] pantoprazole (PROTONIX) EC tablet 40 mg  40 mg Oral Daily Ivor Costa, MD   40 mg at 01/27/20 1007  . [MAR Hold] sodium chloride 0.9 % bolus 500 mL  500 mL Intravenous Once Danford, Suann Larry, MD      . Doug Sou Hold] vitamin E capsule 400 Units  400 Units Oral Daily Ivor Costa, MD   400 Units at 01/27/20 1008  Past Medical History:  Diagnosis Date  . Allergy    seasonal  . Cancer ([place])    left/chemo 1989  . Depression   . Hyperlipidemia   . Hypertension   . Hypothyroidism   . Rheumatic fever   . Thyroid disease    Past Surgical History:  Procedure Laterality Date  . BREAST BIOPSY Right 06/13/2006   benign  . BREAST EXCISIONAL BIOPSY Right 1982   benign  . BREAST IMPLANT REMOVAL     left with replacement implant 2011  . BREAST RECONSTRUCTION     [phone] left  . COLONOSCOPY    . COLONOSCOPY WITH PROPOFOL N/A 06/06/2018   Procedure: COLONOSCOPY WITH PROPOFOL;  Surgeon: Lollie Sails, MD;  Location: [facility] ENDOSCOPY;  Service: Endoscopy;  Laterality: N/A;  . MASTECTOMY Left    left 1989  . POLYPECTOMY     Social History Social History   Tobacco Use  . Smoking status: Former Research scientist (life sciences)  . Smokeless tobacco: Never Used   Substance Use Topics  . Alcohol use: No  . Drug use: No   Family History Family History  Problem Relation Age of Onset  . Heart disease Mother   . Emphysema Father   Denies family history of peripheral artery disease, venous disease or renal disease.  No Known Allergies  REVIEW OF SYSTEMS (Negative unless checked)  Constitutional: [] Weight loss  [] Fever  [] Chills Cardiac: [] Chest pain   [] Chest pressure   [] Palpitations   [] Shortness of breath when laying flat   [] Shortness of breath at rest   [] Shortness of breath with exertion. Vascular:  [] Pain in legs with walking   [] Pain in legs at rest   [] Pain in legs when laying flat   [] Claudication   [] Pain in feet when walking  [] Pain in feet at rest  [] Pain in feet when laying flat   [] History of DVT   [] Phlebitis   [] Swelling in legs   [] Varicose veins   [] Non-healing ulcers Pulmonary:   [] Uses home oxygen   [] Productive cough   [] Hemoptysis   [] Wheeze  [] COPD   [] Asthma Neurologic:  [] Dizziness  [] Blackouts   [] Seizures   [] History of stroke   [] History of TIA  [] Aphasia   [] Temporary blindness   [] Dysphagia   [] Weakness or numbness in arms   [] Weakness or numbness in legs Musculoskeletal:  [] Arthritis   [] Joint swelling   [] Joint pain   [] Low back pain Hematologic:  [] Easy bruising  [] Easy bleeding   [] Hypercoagulable state   [] Anemic  [] Hepatitis Gastrointestinal:  [] Blood in stool   [] Vomiting blood  [] Gastroesophageal reflux/heartburn   [] Difficulty swallowing. Genitourinary:  [x] Chronic kidney disease   [] Difficult urination  [] Frequent urination  [] Burning with urination   [] Blood in urine Skin:  [] Rashes   [] Ulcers   [] Wounds Psychological:  [] History of anxiety   []  History of major depression.  Physical Examination  Vitals:   01/27/20 0404 01/27/20 0747 01/27/20 1204 01/27/20 1605  BP: 121/69 118/78 125/65 (!) 124/52  Pulse: 73 67 66 73  Resp: 20 12 16 16   Temp: 98.4 F (36.9 C) 98.2 F (36.8 C) (!) 97.4 F (36.3 C) 98.6  F (37 C)  TempSrc: Oral Oral Oral Oral  SpO2: 97% 97% 94% 99%  Weight:      Height:       Body mass index is 26.49 kg/m. Gen: WD/WN Head: [place]/AT, No temporalis wasting Ear/Nose/Throat: Hearing grossly intact, nares w/o erythema or drainage Eyes: Sclera non-icteric, conjunctiva clear Neck: Supple, no  nuchal rigidity.  No JVD.  Pulmonary:  Good air movement, clear to auscultation bilaterally.  Cardiac: RRR, normal S1, S2, no Murmurs, rubs or gallops. Vascular: Extremities warm distally in toes. Gastrointestinal: soft, non-tender/non-distended. No guarding/reflex.  Musculoskeletal: M/S 5/5 throughout.  Extremities without ischemic changes.  No deformity or atrophy. Mild Edema in the lower extremities bilaterally Neurologic: Intact no deficits noted. Psychiatric: Difficult to assess due to the severity of patient's illness. Dermatologic: No rashes or ulcers noted.   Lymph : No Cervical, Axillary, or Inguinal lymphadenopathy.  CBC Lab Results  Component Value Date   WBC 8.7 01/27/2020   HGB 8.6 (L) 01/27/2020   HCT 25.9 (L) 01/27/2020   MCV 89.0 01/27/2020   PLT 253 01/27/2020   BMET    Component Value Date/Time   NA 136 01/27/2020 0543   K 3.5 01/27/2020 0543   CL 108 01/27/2020 0543   CO2 17 (L) 01/27/2020 0543   GLUCOSE 78 01/27/2020 0543   BUN 69 (H) 01/27/2020 0543   CREATININE 8.04 (H) 01/27/2020 0543   CALCIUM 8.3 (L) 01/27/2020 0543   GFRNONAA 5 (L) 01/27/2020 0543   GFRAA 5 (L) 01/27/2020 0543   Estimated Creatinine Clearance: 5.6 mL/min (A) (by C-G formula based on SCr of 8.04 mg/dL (H)).  COAG No results found for: INR, PROTIME  Radiology DG Chest 2 View  Result Date: 01/26/2020 CLINICAL DATA:  Shortness of breath EXAM: CHEST - 2 VIEW COMPARISON:  By report from 01/25/2020 FINDINGS: Cardiac shadow is within normal limits. Left breast implant is noted. Left axillary node dissection is seen. The lungs are clear bilaterally. No focal infiltrate or effusion is  seen. No acute bony abnormality is noted. IMPRESSION: No acute abnormality noted. Electronically Signed   By: Inez Catalina M.D.   On: 01/26/2020 15:13   US RENAL  Result Date: 01/26/2020 CLINICAL DATA:  Acute kidney injury EXAM: RENAL / URINARY TRACT ULTRASOUND COMPLETE COMPARISON:  None. FINDINGS: Right Kidney: Renal measurements: 12.2 x 6.2 x 5.9 cm = volume: 237 mL. Diffusely increased echotexture. No mass or hydronephrosis. Left Kidney: Renal measurements: 11.9 x 6.0 x 5.5 cm = volume: 203 mL. Diffusely increased echotexture. No mass or hydronephrosis. Bladder: Appears normal for degree of bladder distention. Other: None. IMPRESSION: No acute findings.  No hydronephrosis. Mildly increased echotexture throughout the kidneys suggesting chronic medical renal disease. Electronically Signed   By: Rolm Baptise M.D.   On: 01/26/2020 18:47   Assessment/Plan Michelle Lambert is a 74 y.o. female with medical history significant of hypertension, hyperlipidemia, hypothyroidism, depression, breast cancer (s/p of mastectomy and chemotherapy 1989), who presents with abnormal lab and generalized weakness.   1. ARF: We will proceed with temporary dialysis catheter placement at this time.  Risks and benefits discussed with patient and/or family, and the catheter will be placed to allow immediate initiation of dialysis.  If the patient's renal function does not improve throughout the hospital course, we will be happy to place a tunneled dialysis catheter for long term use prior to discharge.    2.  Hyperlipidemia: On aspirin and Plavix for medical management Encouraged good control as its slows the progression of atherosclerotic disease  3. Hypertension: Encouraged good control as its slows the progression of atherosclerotic disease  Discussed with Dr. Mayme Genta, PA-C  01/27/2020 4:50 PM

## 2020-01-27 NOTE — Progress Notes (Signed)
Initial Nutrition Assessment  DOCUMENTATION CODES:   Non-severe (moderate) malnutrition in context of social or environmental circumstances  INTERVENTION:   Boost Breeze po TID, each supplement provides 250 kcal and 9 grams of protein  Magic cup TID with meals, each supplement provides 290 kcal and 9 grams of protein  MVI daily   Pt at high refeed risk; recommend monitor K, Mg and P labs daily as oral intake improves.   NUTRITION DIAGNOSIS:   Moderate Malnutrition related to social / environmental circumstances(decreased appetite, nausea and vomiting) as evidenced by mild fat depletion, mild to moderate muscle depletion, 12 percent weight loss in 3 months.  GOAL:   Patient will meet greater than or equal to 90% of their needs  MONITOR:   PO intake, Supplement acceptance, Labs, Weight trends, Skin, I & O's  REASON FOR ASSESSMENT:   Malnutrition Screening Tool    ASSESSMENT:   74 y.o. female with medical history significant of hypertension, hyperlipidemia, hypothyroidism, depression, breast cancer (s/p of mastectomy and chemotherapy 1989), who presents with abnormal lab and generalized weakness.   Met with pt in room today. Pt reports poor appetite and oral intake for several months pta r/t nausea and vomiting. Pt reports that often when she swallows, it will trigger her to start coughing and then she will vomit back up her food. Pt reports that she can often feel food in her mid chest area like it gets stuck. Pt has not yet started to drink any supplements at home; pt reports that she is unsure that she can keep them down. Pt reports significant weight loss. Per chart, pt has lost 20lbs(12%) over the past 3 months; this is significant weight loss. RD discussed with pt the importance of adequate nutrition needed to preserve lean muscle. Pt is willing to try supplements; will start with Boost breeze as pt has been able to keep down some juice today. RD will add supplements to help  pt meet her estimated needs. RD will also liberalize pt's diet as pt is not eating enough to exceed any nutrient limits. Pt is at high refeed risk.    Medications reviewed and include: vitamin C, aspirin, B-complex with C, oscal w/ D, heparin, synthroid, MVI, lovaza, protonix, vitamin E, NaCl @100ml /hr  Labs reviewed: BUN 69(H), creat 8.04(H) Hgb 8.6(L), Hct 25.9(L)  NUTRITION - FOCUSED PHYSICAL EXAM:    Most Recent Value  Orbital Region  No depletion  Upper Arm Region  Mild depletion  Thoracic and Lumbar Region  No depletion  Buccal Region  No depletion  Temple Region  No depletion  Clavicle Bone Region  No depletion  Clavicle and Acromion Bone Region  No depletion  Scapular Bone Region  No depletion  Dorsal Hand  Mild depletion  Patellar Region  Moderate depletion  Anterior Thigh Region  Moderate depletion  Posterior Calf Region  Moderate depletion  Edema (RD Assessment)  None  Hair  Reviewed  Eyes  Reviewed  Mouth  Reviewed  Skin  Reviewed  Nails  Reviewed     Diet Order:   Diet Order            Diet Heart Room service appropriate? Yes; Fluid consistency: Thin  Diet effective now             EDUCATION NEEDS:   Education needs have been addressed  Skin:  Skin Assessment: Reviewed RN Assessment  Last BM:  5/11  Height:   Ht Readings from Last 1 Encounters:  01/26/20 5' 2"  (  1.575 m)    Weight:   Wt Readings from Last 1 Encounters:  01/26/20 65.7 kg    Ideal Body Weight:  50 kg  BMI:  Body mass index is 26.49 kg/m.  Estimated Nutritional Needs:   Kcal:  1500-1700kcal/day  Protein:  75-85g/day  Fluid:  >1.3L/day  Koleen Distance MS, RD, LDN Please refer to [facility] for RD and/or RD on-call/weekend/after hours pager

## 2020-01-27 NOTE — Progress Notes (Signed)
OT Cancellation Note  Patient Details Name: Michelle Lambert MRN: [id number] DOB: [birth year]   Cancelled Treatment:    Reason Eval/Treat Not Completed: OT screened, no needs identified, will sign off. Thank you for the OT consult. Order received and chart reviewed. Upon arrival to pt room, pt with caregiver at bedside. Pt reports feeling generally back to baseline level of functional performance. Endorses getting up to bathroom independently. States she was able to walk with physical therapy this morning and felt steady. Denies falls history or additional need for assistance with ADLs. No acute OT needs identified. Will sing off at this time. Please re-consult if additional OT needs arise during this admission.   Shara Blazing, M.S., OTR/L Ascom: [phone] 01/27/20, 1:38 PM

## 2020-01-27 NOTE — Consult Note (Signed)
[facility] CONSULT NOTE    Date: 01/27/2020                  Patient Name:  Michelle Lambert  MRN: [id number]  DOB: 04-22-1946  Age / Sex: 74 y.o., female         PCP: Maryland Pink, MD                 Service Requesting Consult:  Hospitalist                 Reason for Consult:  Acute kidney injury            History of Present Illness: Patient is a 74 y.o. female with a PMHx of seasonal allergies, left breast cancer, depression, hyperlipidemia, hypertension, hypothyroidism, rheumatic fever, hypothyroidism, recent laryngeal ulcer, who was admitted to [facility] on 01/26/2020 for evaluation of severe acute kidney injury.  Recently she has been following with ENT for a laryngeal ulcer.  As result of his ulcer her p.o. intake has been significantly diminished for several weeks.  Back in November 2020 her creatinine was completely normal at 0.8.  Recently on 01/25/2020 renal function was checked and BUN was 50 with a creatinine of 4.5.  She was then instructed to come to the hospital.  When renal function was rechecked BUN was 67 with a creatinine 8.0 and EGFR of 5.  Renal ultrasound was checked and was negative for hydronephrosis.  Urinalysis was checked and showed urine protein of 100 mg/dL and small hemoglobin noted.  Microscopy showed 11-20 RBCs per high-power field.  She is also had prolonged nausea.  After lengthy discussion with the patient we have advised a temporary course of hemodialysis.  In addition she was taking some Aleve at home intermittently as well.   Medications: Outpatient medications: Facility-Administered Medications Prior to Admission  Medication Dose Route Frequency Provider Last Rate Last Admin  . 0.9 %  sodium chloride infusion  500 mL Intravenous Continuous Lafayette Dragon, MD       Medications Prior to Admission  Medication Sig Dispense Refill Last Dose  . Ascorbic Acid (VITAMIN C) 1000 MG tablet Take 1,000 mg by mouth daily.     01/24/2020 at  Unknown time  . aspirin 81 MG EC tablet Take 81 mg by mouth daily.     01/24/2020 at Unknown time  . b complex vitamins tablet Take 1 tablet by mouth daily.   01/24/2020 at Unknown time  . calcium-vitamin D (OSCAL-500) 500-400 MG-UNIT per tablet Take 1 tablet by mouth daily.     01/24/2020 at Unknown time  . candesartan-hydrochlorothiazide (ATACAND HCT) 16-12.5 MG tablet Take 1 tablet by mouth daily.   01/26/2020 at 1030  . doxycycline (VIBRAMYCIN) 100 MG capsule Take 100 mg by mouth 2 (two) times daily.   01/26/2020 at 1030  . levothyroxine (SYNTHROID) 88 MCG tablet Take 88 mcg by mouth daily.    01/26/2020 at 1030  . montelukast (SINGULAIR) 10 MG tablet Take 10 mg by mouth daily.   01/26/2020 at 1030  . Multiple Vitamin (MULTI-VITAMIN) tablet Take by mouth.   01/24/2020 at Unknown time  . omega-3 acid ethyl esters (LOVAZA) 1 g capsule Take 1 g by mouth 2 (two) times daily.   01/24/2020 at 1030  . omeprazole (PRILOSEC) 40 MG capsule Take 40 mg by mouth daily.   01/24/2020 at 1030  . rosuvastatin (CRESTOR) 20 MG tablet Take 20 mg by mouth daily.  01/24/2020 at Unknown time  . vitamin E 400 UNIT capsule Take 400 Units by mouth daily.     01/24/2020 at 1030    Current medications: Current Facility-Administered Medications  Medication Dose Route Frequency Provider Last Rate Last Admin  . 0.9 %  sodium chloride infusion   Intravenous Continuous Ivor Costa, MD 100 mL/hr at 01/27/20 1258 New Bag at 01/27/20 1258  . acetaminophen (TYLENOL) tablet 650 mg  650 mg Oral Q6H PRN Ivor Costa, MD      . amLODipine (NORVASC) tablet 5 mg  5 mg Oral Daily Ivor Costa, MD   5 mg at 01/27/20 1007  . ascorbic acid (VITAMIN C) tablet 1,000 mg  1,000 mg Oral Daily Ivor Costa, MD   1,000 mg at 01/27/20 1008  . aspirin EC tablet 81 mg  81 mg Oral Daily Gerald Dexter, RPH   81 mg at 01/27/20 1007  . B-complex with vitamin C tablet 1 tablet  1 tablet Oral Daily Ivor Costa, MD   1 tablet at 01/27/20 1008  . calcium-vitamin D  (OSCAL WITH D) 500-200 MG-UNIT per tablet 1 tablet  1 tablet Oral Daily Ivor Costa, MD   1 tablet at 01/27/20 1008  . Chlorhexidine Gluconate Cloth 2 % PADS 6 each  6 each Topical Daily Danford, Suann Larry, MD      . feeding supplement (BOOST / RESOURCE BREEZE) liquid 1 Container  1 Container Oral TID BM Danford, Suann Larry, MD      . heparin injection 5,000 Units  5,000 Units Subcutaneous Q8H Ivor Costa, MD   5,000 Units at 01/27/20 0544  . hydrALAZINE (APRESOLINE) injection 5 mg  5 mg Intravenous Q2H PRN Ivor Costa, MD      . levothyroxine (SYNTHROID) tablet 88 mcg  88 mcg Oral Daily Ivor Costa, MD   88 mcg at 01/27/20 0545  . montelukast (SINGULAIR) tablet 10 mg  10 mg Oral Daily Ivor Costa, MD   10 mg at 01/27/20 1007  . multivitamin with minerals tablet 1 tablet  1 tablet Oral Daily Ivor Costa, MD   1 tablet at 01/27/20 1007  . omega-3 acid ethyl esters (LOVAZA) capsule 1 g  1 g Oral BID Ivor Costa, MD   1 g at 01/27/20 1007  . ondansetron (ZOFRAN) injection 4 mg  4 mg Intravenous Q8H PRN Ivor Costa, MD   4 mg at 01/27/20 1826  . oxymetazoline (AFRIN) 0.05 % nasal spray 1 spray  1 spray Each Nare QID PRN Danford, Suann Larry, MD      . pantoprazole (PROTONIX) EC tablet 40 mg  40 mg Oral Daily Ivor Costa, MD   40 mg at 01/27/20 1007  . sodium chloride (OCEAN) 0.65 % nasal spray 1 spray  1 spray Each Nare PRN Danford, Christopher P, MD      . sodium chloride 0.9 % bolus 500 mL  500 mL Intravenous Once Danford, Suann Larry, MD      . vitamin E capsule 400 Units  400 Units Oral Daily Ivor Costa, MD   400 Units at 01/27/20 1008      Allergies: No Known Allergies    Past Medical History: Past Medical History:  Diagnosis Date  . Allergy    seasonal  . Cancer ([place])    left/chemo 1989  . Depression   . Hyperlipidemia   . Hypertension   . Hypothyroidism   . Rheumatic fever   . Thyroid disease      Past Surgical History:  Past Surgical History:  Procedure Laterality Date   . BREAST BIOPSY Right 06/13/2006   benign  . BREAST EXCISIONAL BIOPSY Right 1982   benign  . BREAST IMPLANT REMOVAL     left with replacement implant 2011  . BREAST RECONSTRUCTION     [phone] left  . COLONOSCOPY    . COLONOSCOPY WITH PROPOFOL N/A 06/06/2018   Procedure: COLONOSCOPY WITH PROPOFOL;  Surgeon: Lollie Sails, MD;  Location: [facility] ENDOSCOPY;  Service: Endoscopy;  Laterality: N/A;  . MASTECTOMY Left    left 1989  . POLYPECTOMY       Family History: Family History  Problem Relation Age of Onset  . Heart disease Mother   . Emphysema Father      Social History: Social History   Socioeconomic History  . Marital status: Married    Spouse name: Not on file  . Number of children: Not on file  . Years of education: Not on file  . Highest education level: Not on file  Occupational History  . Not on file  Tobacco Use  . Smoking status: Former Research scientist (life sciences)  . Smokeless tobacco: Never Used  Substance and Sexual Activity  . Alcohol use: No  . Drug use: No  . Sexual activity: Not on file  Other Topics Concern  . Not on file  Social History Narrative  . Not on file   Social Determinants of Health   Financial Resource Strain:   . Difficulty of Paying Living Expenses:   Food Insecurity:   . Worried About Charity fundraiser in the Last Year:   . Arboriculturist in the Last Year:   Transportation Needs:   . Film/video editor (Medical):   Marland Kitchen Lack of Transportation (Non-Medical):   Physical Activity:   . Days of Exercise per Week:   . Minutes of Exercise per Session:   Stress:   . Feeling of Stress :   Social Connections:   . Frequency of Communication with Friends and Family:   . Frequency of Social Gatherings with Friends and Family:   . Attends Religious Services:   . Active Member of Clubs or Organizations:   . Attends Archivist Meetings:   Marland Kitchen Marital Status:   Intimate Partner Violence:   . Fear of Current or Ex-Partner:   .  Emotionally Abused:   Marland Kitchen Physically Abused:   . Sexually Abused:      Review of Systems: Review of Systems  Constitutional: Positive for malaise/fatigue. Negative for chills and fever.  HENT: Positive for sore throat. Negative for congestion, hearing loss and tinnitus.   Eyes: Negative for blurred vision and double vision.  Respiratory: Negative for cough, sputum production and shortness of breath.   Cardiovascular: Negative for chest pain, palpitations and orthopnea.  Gastrointestinal: Positive for nausea and vomiting. Negative for diarrhea.  Genitourinary: Negative for dysuria, frequency and urgency.  Musculoskeletal: Negative for myalgias.  Skin: Negative for itching and rash.  Neurological: Positive for weakness. Negative for dizziness and focal weakness.  Endo/Heme/Allergies: Negative for polydipsia. Does not bruise/bleed easily.  Psychiatric/Behavioral: Negative for depression. The patient is not nervous/anxious.      Vital Signs: Blood pressure 128/63, pulse 66, temperature 98.3 F (36.8 C), temperature source Oral, resp. rate 16, height 5' 2"  (1.575 m), weight 65.7 kg, SpO2 100 %.  Weight trends: Filed Weights   01/26/20 1138 01/26/20 1957  Weight: 65.8 kg 65.7 kg    Physical Exam: General: NAD, laying in bed  Head: Normocephalic, atraumatic.  Eyes: Anicteric, EOMI  Nose: Mucous membranes moist, not inflammed, nonerythematous.  Throat: Oropharynx nonerythematous, no exudate appreciated.   Neck: Supple, trachea midline.  Lungs:  Normal respiratory effort. Clear to auscultation BL without crackles or wheezes.  Heart: RRR. S1 and S2 normal without gallop, murmur, or rubs.  Abdomen:  BS normoactive. Soft, Nondistended, non-tender.  No masses or organomegaly.  Extremities: No pretibial edema.  Neurologic: A&O X3, Motor strength is 5/5 in the all 4 extremities  Skin: No visible rashes, scars.    Lab results: Basic Metabolic Panel: Recent Labs  Lab 01/26/20 1203  01/27/20 0543  NA 135 136  K 3.8 3.5  CL 103 108  CO2 20* 17*  GLUCOSE 106* 78  BUN 67* 69*  CREATININE 8.01* 8.04*  CALCIUM 9.1 8.3*    Liver Function Tests: Recent Labs  Lab 01/26/20 1203  AST 15  ALT 11  ALKPHOS 55  BILITOT 1.0  PROT 7.8  ALBUMIN 3.4*   No results for input(s): LIPASE, AMYLASE in the last 168 hours. No results for input(s): AMMONIA in the last 168 hours.  CBC: Recent Labs  Lab 01/26/20 1203 01/27/20 0543  WBC 11.4* 8.7  HGB 10.3* 8.6*  HCT 29.4* 25.9*  MCV 85.7 89.0  PLT 321 253    Cardiac Enzymes: No results for input(s): CKTOTAL, CKMB, CKMBINDEX, TROPONINI in the last 168 hours.  BNP: Invalid input(s): POCBNP  CBG: Recent Labs  Lab 01/27/20 4081  KGYJEH 63    Microbiology: Results for orders placed or performed during the hospital encounter of 01/26/20  SARS Coronavirus 2 by RT PCR (hospital order, performed in [facility] hospital lab) Nasopharyngeal Nasopharyngeal Swab     Status: None   Collection Time: 01/26/20  3:54 PM   Specimen: Nasopharyngeal Swab  Result Value Ref Range Status   SARS Coronavirus 2 NEGATIVE NEGATIVE Final    Comment: (NOTE) SARS-CoV-2 target nucleic acids are NOT DETECTED. The SARS-CoV-2 RNA is generally detectable in upper and lower respiratory specimens during the acute phase of infection. The lowest concentration of SARS-CoV-2 viral copies this assay can detect is 250 copies / mL. A negative result does not preclude SARS-CoV-2 infection and should not be used as the sole basis for treatment or other patient management decisions.  A negative result may occur with improper specimen collection / handling, submission of specimen other than nasopharyngeal swab, presence of viral mutation(s) within the areas targeted by this assay, and inadequate number of viral copies (<250 copies / mL). A negative result must be combined with clinical observations, patient history, and epidemiological  information. Fact Sheet for Patients:   StrictlyIdeas.no Fact Sheet for Healthcare Providers: BankingDealers.co.za This test is not yet approved or cleared  by the [place] FDA and has been authorized for detection and/or diagnosis of SARS-CoV-2 by FDA under an Emergency Use Authorization (EUA).  This EUA will remain in effect (meaning this test can be used) for the duration of the COVID-19 declaration under Section 564(b)(1) of the Act, 21 U.S.C. section 360bbb-3(b)(1), unless the authorization is terminated or revoked sooner. Performed at [facility], [address] [place], [place] [address]     Coagulation Studies: No results for input(s): LABPROT, INR in the last 72 hours.  Urinalysis: Recent Labs    01/26/20 1203  COLORURINE YELLOW*  LABSPEC 1.008  PHURINE 7.0  GLUCOSEU NEGATIVE  HGBUR SMALL*  BILIRUBINUR NEGATIVE  KETONESUR NEGATIVE  PROTEINUR 100*  NITRITE NEGATIVE  LEUKOCYTESUR LARGE*  Imaging: DG Chest 2 View  Result Date: 01/26/2020 CLINICAL DATA:  Shortness of breath EXAM: CHEST - 2 VIEW COMPARISON:  By report from 01/25/2020 FINDINGS: Cardiac shadow is within normal limits. Left breast implant is noted. Left axillary node dissection is seen. The lungs are clear bilaterally. No focal infiltrate or effusion is seen. No acute bony abnormality is noted. IMPRESSION: No acute abnormality noted. Electronically Signed   By: Inez Catalina M.D.   On: 01/26/2020 15:13   US RENAL  Result Date: 01/26/2020 CLINICAL DATA:  Acute kidney injury EXAM: RENAL / URINARY TRACT ULTRASOUND COMPLETE COMPARISON:  None. FINDINGS: Right Kidney: Renal measurements: 12.2 x 6.2 x 5.9 cm = volume: 237 mL. Diffusely increased echotexture. No mass or hydronephrosis. Left Kidney: Renal measurements: 11.9 x 6.0 x 5.5 cm = volume: 203 mL. Diffusely increased echotexture. No mass or hydronephrosis. Bladder: Appears normal for degree  of bladder distention. Other: None. IMPRESSION: No acute findings.  No hydronephrosis. Mildly increased echotexture throughout the kidneys suggesting chronic medical renal disease. Electronically Signed   By: Rolm Baptise M.D.   On: 01/26/2020 18:47   PERIPHERAL VASCULAR CATHETERIZATION  Result Date: 01/27/2020 See op note     Assessment & Plan: Pt is a 74 y.o. female with a PMHx of seasonal allergies, left breast cancer, depression, hyperlipidemia, hypertension, hypothyroidism, rheumatic fever, hypothyroidism, recent laryngeal ulcer, who was admitted to [facility] on 01/26/2020 for evaluation of severe acute kidney injury.   1.  Acute kidney injury, differential diagnosis extensive but suspect prolonged dehydration as a result of laryngeal ulcer. 2.  Microscopic hematuria. 3.  Proteinuria. 4.  Metabolic acidosis. 5.  Anemia unspecified.  Plan: Patient presents with an interesting case.  She was found to have a laryngeal ulceration.  This led to significantly diminished p.o. intake for several weeks.  Therefore prolonged dehydration with resultant acute tubular necrosis is certainly a consideration.  However the presence of ulceration does raise the specter of underlying vasculitic disease.  Therefore we will obtain ANA, ANCA antibodies, GBM antibodies, C3, C4.  We will also obtain SPEP and UPEP.  Given the significant uremia and elevation of creatinine we have recommended initiation of dialysis.  We will perform first dialysis treatment tomorrow.  Appreciate vascular surgery assistance by placement of temporary dialysis catheter.  We may need to consider renal biopsy as well but await serologic work-up first.  Thanks for consultation on this interesting and complex case.

## 2020-01-27 NOTE — Progress Notes (Signed)
PROGRESS NOTE    Michelle Lambert  WFU:[id number] DOB: Dec 30, 1945 DOA: 01/26/2020 PCP: [place] Pink, MD      Brief Narrative:  Michelle Lambert is a 74 y.o. F with HTN, hypothyroidism and remote BrCa who presented with several weeks progressive generalized weakness, nausea, as well as acute renal failure.  Patient has started to develop generalized weakness, anorexia, and malaise several weeks ago, roughly around the time she was diagnosed with a laryngeal ulcer.  Then recently she was seen by her PCP who ordered a BMP which was abnormal and she was sent to the ER due to her acute kidney injury.  In the ER, creatinine 8, up from 5 two days prior (BUN 67, baseline creatinine last November 0.8).  Renal US unremarkable.  CXR clear.  UA with heavy leukocytes.         Assessment & Plan:  Acute renal failure 600 cc urine output in last 24 hours. History negative for NSAIDs (maybe once or twice per week), no recent contrast exposure.  Uses ARB and HCTZ.  Omeprazole associated with AIN.    UA with WBCs.  No symptoms of UTI, consider AIN. -Continue IV fluids -Trend UOP -Trend BMP -Consult Nephrology, appreciate cares     Moderate protein calorie malnutrition -Consult nutrition  Hypertension Hyperlipidemia BP normal -Continue Crestor -Continue amlodipine -Hold candesartan/HCTZ  Hypothyroidism -Continue levothyroxine  GERD -Hold omeprazole -Continue pantoprazole  Anemia of renal failure Hgb lower, likely dilutional, no clinical bleeding        Disposition: Status is: Inpatient  Remains inpatient appropriate because:oliguric renal failure   Dispo: The patient is from: Home              Anticipated d/c is to: Home              Anticipated d/c date is: > 3 days              Patient currently is not medically stable to d/c.               MDM: The below labs and imaging reports were reviewed and summarized above.  Medication management as above.   This is an acute illness with threat to life or bodily function.    DVT prophylaxis: Heparin Code Status: FULL Family Communication: Husband at the bedside    Consultants:   Nephrology  Procedures:   Temporary HD catheter insertion pending  Antimicrobials:      Culture data:              Subjective: Patient still nauseated, tired, anorexic.  No fever, confusion, dysuria, abdominal pain.  No swelling.  No dyspnea, no orthopnea.  Objective: Vitals:   01/27/20 0105 01/27/20 0404 01/27/20 0747 01/27/20 1204  BP: 139/66 121/69 118/78 125/65  Pulse: 73 73 67 66  Resp: 20 20 12 16   Temp: 98.4 F (36.9 C) 98.4 F (36.9 C) 98.2 F (36.8 C) (!) 97.4 F (36.3 C)  TempSrc: Oral Oral Oral Oral  SpO2: 97% 97% 97% 94%  Weight:      Height:        Intake/Output Summary (Last 24 hours) at 01/27/2020 1214 Last data filed at 01/27/2020 1100 Gross per 24 hour  Intake 2641.86 ml  Output 920 ml  Net 1721.86 ml   Filed Weights   01/26/20 1138 01/26/20 1957  Weight: 65.8 kg 65.7 kg    Examination: General appearance:  adult female, alert and in no acute distress.  Appears tired, lying in  bed.  Listless. HEENT: Anicteric, conjunctiva pink, lids and lashes normal. No nasal deformity, discharge, epistaxis.  Lips moist, dentures in place, oropharynx moist, no oral lesions, hearing normal.   Skin: Warm and dry.  No jaundice.  No suspicious rashes or lesions. Cardiac: RRR, nl S1-S2, no murmurs appreciated.  Capillary refill is brisk.  JVP not visible.  No LE edema.  Radial pulses 2+ and symmetric. Respiratory: Normal respiratory rate and rhythm.  CTAB without rales or wheezes. Abdomen: Abdomen soft.  No TTP or guarding. No ascites, distension, hepatosplenomegaly.   MSK: No deformities or effusions. Neuro: Awake and alert.  EOMI, moves all extremities. Speech fluent.    Psych: Sensorium intact and responding to questions, attention normal. Affect blunted.  Judgment and  insight appear normal.    Data Reviewed: I have personally reviewed following labs and imaging studies:  CBC: Recent Labs  Lab 01/26/20 1203 01/27/20 0543  WBC 11.4* 8.7  HGB 10.3* 8.6*  HCT 29.4* 25.9*  MCV 85.7 89.0  PLT 321 767   Basic Metabolic Panel: Recent Labs  Lab 01/26/20 1203 01/27/20 0543  NA 135 136  K 3.8 3.5  CL 103 108  CO2 20* 17*  GLUCOSE 106* 78  BUN 67* 69*  CREATININE 8.01* 8.04*  CALCIUM 9.1 8.3*   GFR: Estimated Creatinine Clearance: 5.6 mL/min (A) (by C-G formula based on SCr of 8.04 mg/dL (H)). Liver Function Tests: Recent Labs  Lab 01/26/20 1203  AST 15  ALT 11  ALKPHOS 55  BILITOT 1.0  PROT 7.8  ALBUMIN 3.4*   No results for input(s): LIPASE, AMYLASE in the last 168 hours. No results for input(s): AMMONIA in the last 168 hours. Coagulation Profile: No results for input(s): INR, PROTIME in the last 168 hours. Cardiac Enzymes: No results for input(s): CKTOTAL, CKMB, CKMBINDEX, TROPONINI in the last 168 hours. BNP (last 3 results) No results for input(s): PROBNP in the last 8760 hours. HbA1C: No results for input(s): HGBA1C in the last 72 hours. CBG: Recent Labs  Lab 01/27/20 0744  GLUCAP 73   Lipid Profile: No results for input(s): CHOL, HDL, LDLCALC, TRIG, CHOLHDL, LDLDIRECT in the last 72 hours. Thyroid Function Tests: No results for input(s): TSH, T4TOTAL, FREET4, T3FREE, THYROIDAB in the last 72 hours. Anemia Panel: No results for input(s): VITAMINB12, FOLATE, FERRITIN, TIBC, IRON, RETICCTPCT in the last 72 hours. Urine analysis:    Component Value Date/Time   COLORURINE YELLOW (A) 01/26/2020 1203   APPEARANCEUR CLOUDY (A) 01/26/2020 1203   LABSPEC 1.008 01/26/2020 1203   PHURINE 7.0 01/26/2020 1203   GLUCOSEU NEGATIVE 01/26/2020 1203   HGBUR SMALL (A) 01/26/2020 1203   BILIRUBINUR NEGATIVE 01/26/2020 1203   KETONESUR NEGATIVE 01/26/2020 1203   PROTEINUR 100 (A) 01/26/2020 1203   NITRITE NEGATIVE 01/26/2020 1203    LEUKOCYTESUR LARGE (A) 01/26/2020 1203   Sepsis Labs: @LABRCNTIP (procalcitonin:4,lacticacidven:4)  ) Recent Results (from the past 240 hour(s))  SARS Coronavirus 2 by RT PCR (hospital order, performed in [place] hospital lab) Nasopharyngeal Nasopharyngeal Swab     Status: None   Collection Time: 01/26/20  3:54 PM   Specimen: Nasopharyngeal Swab  Result Value Ref Range Status   SARS Coronavirus 2 NEGATIVE NEGATIVE Final    Comment: (NOTE) SARS-CoV-2 target nucleic acids are NOT DETECTED. The SARS-CoV-2 RNA is generally detectable in upper and lower respiratory specimens during the acute phase of infection. The lowest concentration of SARS-CoV-2 viral copies this assay can detect is 250 copies / mL. A negative  result does not preclude SARS-CoV-2 infection and should not be used as the sole basis for treatment or other patient management decisions.  A negative result may occur with improper specimen collection / handling, submission of specimen other than nasopharyngeal swab, presence of viral mutation(s) within the areas targeted by this assay, and inadequate number of viral copies (<250 copies / mL). A negative result must be combined with clinical observations, patient history, and epidemiological information. Fact Sheet for Patients:   StrictlyIdeas.no Fact Sheet for Healthcare Providers: BankingDealers.co.za This test is not yet approved or cleared  by the [place] FDA and has been authorized for detection and/or diagnosis of SARS-CoV-2 by FDA under an Emergency Use Authorization (EUA).  This EUA will remain in effect (meaning this test can be used) for the duration of the COVID-19 declaration under Section 564(b)(1) of the Act, 21 U.S.C. section 360bbb-3(b)(1), unless the authorization is terminated or revoked sooner. Performed at [facility], [address], [place], [place] [address]           Radiology Studies: DG Chest 2 View  Result Date: 01/26/2020 CLINICAL DATA:  Shortness of breath EXAM: CHEST - 2 VIEW COMPARISON:  By report from 01/25/2020 FINDINGS: Cardiac shadow is within normal limits. Left breast implant is noted. Left axillary node dissection is seen. The lungs are clear bilaterally. No focal infiltrate or effusion is seen. No acute bony abnormality is noted. IMPRESSION: No acute abnormality noted. Electronically Signed   By: Inez Catalina M.D.   On: 01/26/2020 15:13   US RENAL  Result Date: 01/26/2020 CLINICAL DATA:  Acute kidney injury EXAM: RENAL / URINARY TRACT ULTRASOUND COMPLETE COMPARISON:  None. FINDINGS: Right Kidney: Renal measurements: 12.2 x 6.2 x 5.9 cm = volume: 237 mL. Diffusely increased echotexture. No mass or hydronephrosis. Left Kidney: Renal measurements: 11.9 x 6.0 x 5.5 cm = volume: 203 mL. Diffusely increased echotexture. No mass or hydronephrosis. Bladder: Appears normal for degree of bladder distention. Other: None. IMPRESSION: No acute findings.  No hydronephrosis. Mildly increased echotexture throughout the kidneys suggesting chronic medical renal disease. Electronically Signed   By: Rolm Baptise M.D.   On: 01/26/2020 18:47        Scheduled Meds: . amLODipine  5 mg Oral Daily  . vitamin C  1,000 mg Oral Daily  . aspirin EC  81 mg Oral Daily  . B-complex with vitamin C  1 tablet Oral Daily  . calcium-vitamin D  1 tablet Oral Daily  . feeding supplement  1 Container Oral TID BM  . heparin  5,000 Units Subcutaneous Q8H  . levothyroxine  88 mcg Oral Daily  . montelukast  10 mg Oral Daily  . multivitamin with minerals  1 tablet Oral Daily  . omega-3 acid ethyl esters  1 g Oral BID  . pantoprazole  40 mg Oral Daily  . rosuvastatin  20 mg Oral Daily  . vitamin E  400 Units Oral Daily   Continuous Infusions: . sodium chloride Stopped (01/27/20 0534)     LOS: 0 days    Time spent: 35 minutes    Edwin Dada,  MD [facility] 01/27/2020, 12:14 PM     Please page though [place] or Epic secure chat:  For [employer], Adult nurse

## 2020-01-27 NOTE — Op Note (Signed)
  OPERATIVE NOTE   PROCEDURE: 1. Ultrasound guidance for vascular access right femoral vein 2. Placement of a 30 cm triple lumen dialysis catheter right femoral vein  PRE-OPERATIVE DIAGNOSIS: 1. Acute on chronic renal failure  POST-OPERATIVE DIAGNOSIS: Same  SURGEON: Leotis Pain, MD  ASSISTANT(S): None  ANESTHESIA: local  ESTIMATED BLOOD LOSS: Minimal   FINDING(S): 1. None  SPECIMEN(S): None  INDICATIONS:  Patient is a 74 y.o.female who presents with acute on chronic renal failure.  Risks and benefits were discussed, and informed consent was obtained..  DESCRIPTION: After obtaining full informed written consent, the patient was laid flat in the bed. The right groin was sterilely prepped and draped in a sterile surgical field was created. The right femoral vein was visualized with ultrasound and found to be widely patent. It was then accessed under direct guidance without difficulty with a Seldinger needle and a permanent image was recorded. A J-wire was then placed. After skin nick and dilatation, a 30 cm triple lumen dialysis catheter was placed over the wire and the wire was removed. The lumens withdrew dark red nonpulsatile blood and flushed easily with sterile saline. The catheter was secured to the skin with 3 nylon sutures. Sterile dressing was placed.  COMPLICATIONS: None  CONDITION: Stable  Leotis Pain 01/27/2020 5:28 PM  This note was created with Dragon Medical transcription system. Any errors in dictation are purely unintentional.

## 2020-01-27 NOTE — Progress Notes (Signed)
Physical Therapy Evaluation Patient Details Name: Michelle Lambert MRN: [id number] DOB: 11-Jun-1946 Today's Date: 01/27/2020   History of Present Illness  Per MD note:Michelle Lambert is a 74 y.o. female with medical history significant of hypertension, hyperlipidemia, hypothyroidism, depression, breast cancer (s/p of mastectomy and chemotherapy 1989), who presents with abnormal lab and generalized weakness.  Clinical Impression  Patient agrees to PT evaluation. She reports no pain anywhere. She is independent with bed mobility, independent with transfers sit to stand without assistive device and independent with ambulation 30 feet without assistive device. She has no balance impairments and no strength impairments at this time and has no skilled PT needs. She does not need any PT follow up when [facility] .      Follow Up Recommendations No PT follow up    Equipment Recommendations  None recommended by PT    Recommendations for Other Services       Precautions / Restrictions Precautions Precautions: None Restrictions Weight Bearing Restrictions: No      Mobility  Bed Mobility Overal bed mobility: Independent                Transfers Overall transfer level: Independent Equipment used: None             General transfer comment: [facility]  Ambulation/Gait Ambulation/Gait assistance: Independent Gait Distance (Feet): 30 Feet Assistive device: None Gait Pattern/deviations: Step-through pattern        Stairs Stairs: (WFL)          Wheelchair Mobility    Modified Rankin (Stroke Patients Only)       Balance Overall balance assessment: Independent                                           Pertinent Vitals/Pain Pain Assessment: No/denies pain    Home Living Family/patient expects to be discharged to:: Private residence Living Arrangements: Spouse/significant other Available Help at Discharge: Family Type of Home: House Home Access: Stairs to  enter Entrance Stairs-Rails: Right Entrance Stairs-Number of Steps: 3   Home Equipment: None      Prior Function Level of Independence: Independent               Hand Dominance        Extremity/Trunk Assessment   Upper Extremity Assessment Upper Extremity Assessment: Overall WFL for tasks assessed    Lower Extremity Assessment Lower Extremity Assessment: Overall WFL for tasks assessed       Communication   Communication: No difficulties  Cognition Arousal/Alertness: Awake/alert Behavior During Therapy: WFL for tasks assessed/performed Overall Cognitive Status: Within Functional Limits for tasks assessed                                        General Comments      Exercises     Assessment/Plan    PT Assessment Patent does not need any further PT services  PT Problem List         PT Treatment Interventions      PT Goals (Current goals can be found in the Care Plan section)  Acute Rehab PT Goals Patient Stated Goal: no goals stated PT Goal Formulation: All assessment and education complete, DC therapy    Frequency     Barriers to discharge  Co-evaluation               AM-PAC PT "6 Clicks" Mobility  Outcome Measure Help needed turning from your back to your side while in a flat bed without using bedrails?: None Help needed moving from lying on your back to sitting on the side of a flat bed without using bedrails?: None Help needed moving to and from a bed to a chair (including a wheelchair)?: None Help needed standing up from a chair using your arms (e.g., wheelchair or bedside chair)?: None Help needed to walk in hospital room?: None Help needed climbing 3-5 steps with a railing? : None 6 Click Score: 24    End of Session Equipment Utilized During Treatment: Gait belt Activity Tolerance: Patient tolerated treatment well Patient left: in chair Nurse Communication: Mobility status PT Visit Diagnosis: Muscle  weakness (generalized) (M62.81)    Time: 1030-1050 PT Time Calculation (min) (ACUTE ONLY): 20 min   Charges:   PT Evaluation $PT Eval Low Complexity: 1 Low            Alanson Puls, PT DPT 01/27/2020, 11:36 AM

## 2020-01-28 ENCOUNTER — Encounter: Payer: Self-pay | Admitting: Cardiology

## 2020-01-28 LAB — CBC
HCT: 25.8 % — ABNORMAL LOW (ref 36.0–46.0)
Hemoglobin: 8.9 g/dL — ABNORMAL LOW (ref 12.0–15.0)
MCH: 30.1 pg (ref 26.0–34.0)
MCHC: 34.5 g/dL (ref 30.0–36.0)
MCV: 87.2 fL (ref 80.0–100.0)
Platelets: 245 10*3/uL (ref 150–400)
RBC: 2.96 MIL/uL — ABNORMAL LOW (ref 3.87–5.11)
RDW: 12.3 % (ref 11.5–15.5)
WBC: 7.1 10*3/uL (ref 4.0–10.5)
nRBC: 0 % (ref 0.0–0.2)

## 2020-01-28 LAB — RENAL FUNCTION PANEL
Albumin: 2.9 g/dL — ABNORMAL LOW (ref 3.5–5.0)
Anion gap: 10 (ref 5–15)
BUN: 65 mg/dL — ABNORMAL HIGH (ref 8–23)
CO2: 19 mmol/L — ABNORMAL LOW (ref 22–32)
Calcium: 8.3 mg/dL — ABNORMAL LOW (ref 8.9–10.3)
Chloride: 113 mmol/L — ABNORMAL HIGH (ref 98–111)
Creatinine, Ser: 7.55 mg/dL — ABNORMAL HIGH (ref 0.44–1.00)
GFR calc Af Amer: 6 mL/min — ABNORMAL LOW (ref >60)
GFR calc non Af Amer: 5 mL/min — ABNORMAL LOW (ref >60)
Glucose, Bld: 85 mg/dL (ref 70–99)
Phosphorus: 5.3 mg/dL — ABNORMAL HIGH (ref 2.5–4.6)
Potassium: 3.3 mmol/L — ABNORMAL LOW (ref 3.5–5.1)
Sodium: 142 mmol/L (ref 135–145)

## 2020-01-28 LAB — PHOSPHORUS: Phosphorus: 3.8 mg/dL (ref 2.5–4.6)

## 2020-01-28 LAB — GLUCOSE, CAPILLARY: Glucose-Capillary: 78 mg/dL (ref 70–99)

## 2020-01-28 LAB — HEPATITIS B SURFACE ANTIGEN: Hepatitis B Surface Ag: NONREACTIVE

## 2020-01-28 LAB — UREA NITROGEN, URINE: Urea Nitrogen, Ur: 278 mg/dL

## 2020-01-28 MED ORDER — LIDOCAINE HCL (PF) 1 % IJ SOLN: 5.0000 mL | INTRAMUSCULAR | Status: DC | PRN

## 2020-01-28 MED ORDER — PENTAFLUOROPROP-TETRAFLUOROETH EX AERO
1.0000 "application " | INHALATION_SPRAY | CUTANEOUS | Status: DC | PRN
  Filled 2020-01-28: qty 30

## 2020-01-28 MED ORDER — SODIUM CHLORIDE 0.9 % IV SOLN: 100.0000 mL | INTRAVENOUS | Status: DC | PRN

## 2020-01-28 MED ORDER — PROMETHAZINE HCL 25 MG RE SUPP
25.0000 mg | Freq: Four times a day (QID) | RECTAL | Status: DC | PRN
  Filled 2020-01-28: qty 1

## 2020-01-28 MED ORDER — ALTEPLASE 2 MG IJ SOLR: 2.0000 mg | Freq: Once | INTRAMUSCULAR | Status: DC | PRN

## 2020-01-28 MED ORDER — PROMETHAZINE HCL 25 MG/ML IJ SOLN
12.5000 mg | Freq: Four times a day (QID) | INTRAMUSCULAR | Status: DC | PRN
  Administered 2020-01-29 – 2020-01-30 (×3): 12.5 mg via INTRAVENOUS
  Filled 2020-01-28 (×3): qty 1

## 2020-01-28 MED ORDER — PROMETHAZINE HCL 25 MG PO TABS
25.0000 mg | ORAL_TABLET | Freq: Four times a day (QID) | ORAL | Status: DC | PRN
  Administered 2020-01-28 – 2020-01-31 (×6): 25 mg via ORAL
  Filled 2020-01-28 (×6): qty 1

## 2020-01-28 MED ORDER — LIDOCAINE-PRILOCAINE 2.5-2.5 % EX CREA
1.0000 "application " | TOPICAL_CREAM | CUTANEOUS | Status: DC | PRN
  Filled 2020-01-28: qty 5

## 2020-01-28 MED ORDER — HEPARIN SODIUM (PORCINE) 1000 UNIT/ML DIALYSIS: 1000.0000 [IU] | INTRAMUSCULAR | Status: DC | PRN

## 2020-01-28 NOTE — Progress Notes (Signed)
Pre HD  

## 2020-01-28 NOTE — Progress Notes (Signed)
HD started. 

## 2020-01-28 NOTE — Progress Notes (Signed)
HD ended 

## 2020-01-28 NOTE — Progress Notes (Signed)
PROGRESS NOTE    Michelle Lambert  CMK:[id number] DOB: [birth year] DOA: 01/26/2020 PCP: [place] Pink, MD      Brief Narrative:  Michelle Lambert is a 74 y.o. F with HTN, hypothyroidism and remote BrCa who presented with several weeks progressive generalized weakness, nausea, as well as acute renal failure.  Patient has started to develop generalized weakness, anorexia, and malaise several weeks ago, roughly around the time she was diagnosed with a laryngeal ulcer.  Then recently she was seen by her PCP who ordered a BMP which was abnormal and she was sent to the ER due to her acute kidney injury.  In the ER, creatinine 8, up from 5 two days prior (BUN 67, baseline creatinine last November 0.8).  Renal US unremarkable.  CXR clear.  UA with heavy leukocytes.         Assessment & Plan:  Acute renal failure Patient presented with several weeks symptoms, now Cr up to 5 as outpatient, 8 here.  History negative for NSAIDs (maybe once or twice per week), no recent contrast exposure.  Uses ARB and HCTZ.  Omeprazole associated with AIN and UA with many white cells.    Fem vascath placed yesterday.  Nephrology plan for dialysis today. BP good.  UOP excellent >2L but Cr only down to 7.55. -Continue IV fluids -Trend UOP, BMP -Consult Nephrology, appreciate cares -Follow SPEP -Follow ANCA, GBM, complements, ANA   Sinusitis Mild. Symptoms of congestion, drainage and cough.  Had two doses of doxycycline prior to admission.   -Will defer treatmnet for now, follow clinically  Moderate protein calorie malnutrition -Consult nutrition  Hypertension Hyperlipidemia BP normal -Continue Crestor -Continue amlodipine -Hold candesartan/HCTZ  Hypothyroidism -Continue levothyroxine  GERD -Hold omeprazole -Continue pantoprazole  Anemia of renal failure Hgb stable, no clinical bleeding        Disposition: Status is: Inpatient  Remains inpatient appropriate because:oliguric renal  failure requiring hemodialysis   Dispo: The patient is from: Home              Anticipated d/c is to: TBD              Anticipated d/c date is: > 3 days              Patient currently is not medically stable to d/c.   The patient had a Vas-Cath placed last night, and is undergoing dialysis for the first time today.  We will monitor urine output, monitor for need for further dialysis.  Monitor renal function.  Likely several more days in the hospital until renal function improving, or dialysis established.            MDM: The below labs and imaging reports reviewed and summarized above.  Medication management as above.  Is an acute illness with threat to life or bodily function.      DVT prophylaxis: Heparin Code Status: FULL Family Communication: Patient seen on dialysis    Consultants:   Nephrology  IR  Procedures:   5/12 temporary HD catheter insertion   5/13 first dialysis session  Antimicrobials:      Culture data:              Subjective: Patient is nausea is somewhat better, however still present.  No fever, confusion, dysuria, abdominal pain, vomiting.  No dyspnea, swelling, orthopnea.  She has some mild sinus congestion, improved from yesterday.       Objective: Vitals:   01/28/20 1015 01/28/20 1030 01/28/20 1045 01/28/20 1222  BP: (!) 142/70 (!) 144/66 (!) 143/66 (!) 145/67  Pulse: 63 62 64 65  Resp: 17 16 16 16   Temp:    98.5 F (36.9 C)  TempSrc:    Oral  SpO2:    99%  Weight:      Height:        Intake/Output Summary (Last 24 hours) at 01/28/2020 1241 Last data filed at 01/28/2020 1224 Gross per 24 hour  Intake 120 ml  Output 2425 ml  Net -2305 ml   Filed Weights   01/26/20 1138 01/26/20 1957 01/28/20 0936  Weight: 65.8 kg 65.7 kg 65.7 kg    Examination: General appearance: Adult female, lying in bed, on dialysis, sleeping, easily arousable.     HEENT: Anicteric, conjunctive pink, lids and lashes normal.  No  nasal deformity, discharge, or epistaxis.  Lips moist, dentures in place, oropharynx moist, no oral lesions, hearing normal Skin: No suspicious rashes or lesions Cardiac: Regular rate and rhythm, no murmurs appreciated, JVP not visible but no lower extremity edema, Respiratory: Normal respiratory rate and rhythm, lungs clear without rales or wheezes Abdomen: Abdomen soft without tenderness palpation or guarding, no ascites or distention MSK: No muscle bulk and tone Neuro: Awake and alert, extraocular movements intact, moves all extremities with normal strength and coordination, speech fluent. Psych: Sensorium intact responding questions, attention normal, affect normal, judgment insight appear normal.        Data Reviewed: I have personally reviewed following labs and imaging studies:  CBC: Recent Labs  Lab 01/26/20 1203 01/27/20 0543 01/28/20 0505  WBC 11.4* 8.7 7.1  HGB 10.3* 8.6* 8.9*  HCT 29.4* 25.9* 25.8*  MCV 85.7 89.0 87.2  PLT 321 253 675   Basic Metabolic Panel: Recent Labs  Lab 01/26/20 1203 01/27/20 0543 01/28/20 0505 01/28/20 1020  NA 135 136 142  --   K 3.8 3.5 3.3*  --   CL 103 108 113*  --   CO2 20* 17* 19*  --   GLUCOSE 106* 78 85  --   BUN 67* 69* 65*  --   CREATININE 8.01* 8.04* 7.55*  --   CALCIUM 9.1 8.3* 8.3*  --   PHOS  --   --  5.3* 3.8   GFR: Estimated Creatinine Clearance: 6 mL/min (A) (by C-G formula based on SCr of 7.55 mg/dL (H)). Liver Function Tests: Recent Labs  Lab 01/26/20 1203 01/28/20 0505  AST 15  --   ALT 11  --   ALKPHOS 55  --   BILITOT 1.0  --   PROT 7.8  --   ALBUMIN 3.4* 2.9*   No results for input(s): LIPASE, AMYLASE in the last 168 hours. No results for input(s): AMMONIA in the last 168 hours. Coagulation Profile: No results for input(s): INR, PROTIME in the last 168 hours. Cardiac Enzymes: No results for input(s): CKTOTAL, CKMB, CKMBINDEX, TROPONINI in the last 168 hours. BNP (last 3 results) No results  for input(s): PROBNP in the last 8760 hours. HbA1C: No results for input(s): HGBA1C in the last 72 hours. CBG: Recent Labs  Lab 01/27/20 0744 01/28/20 0743  GLUCAP 73 78   Lipid Profile: No results for input(s): CHOL, HDL, LDLCALC, TRIG, CHOLHDL, LDLDIRECT in the last 72 hours. Thyroid Function Tests: No results for input(s): TSH, T4TOTAL, FREET4, T3FREE, THYROIDAB in the last 72 hours. Anemia Panel: No results for input(s): VITAMINB12, FOLATE, FERRITIN, TIBC, IRON, RETICCTPCT in the last 72 hours. Urine analysis:    Component Value Date/Time  COLORURINE YELLOW (A) 01/26/2020 1203   APPEARANCEUR CLOUDY (A) 01/26/2020 1203   LABSPEC 1.008 01/26/2020 1203   PHURINE 7.0 01/26/2020 1203   GLUCOSEU NEGATIVE 01/26/2020 1203   HGBUR SMALL (A) 01/26/2020 1203   BILIRUBINUR NEGATIVE 01/26/2020 1203   KETONESUR NEGATIVE 01/26/2020 1203   PROTEINUR 100 (A) 01/26/2020 1203   NITRITE NEGATIVE 01/26/2020 1203   LEUKOCYTESUR LARGE (A) 01/26/2020 1203   Sepsis Labs: @LABRCNTIP (procalcitonin:4,lacticacidven:4)  ) Recent Results (from the past 240 hour(s))  SARS Coronavirus 2 by RT PCR (hospital order, performed in [place] hospital lab) Nasopharyngeal Nasopharyngeal Swab     Status: None   Collection Time: 01/26/20  3:54 PM   Specimen: Nasopharyngeal Swab  Result Value Ref Range Status   SARS Coronavirus 2 NEGATIVE NEGATIVE Final    Comment: (NOTE) SARS-CoV-2 target nucleic acids are NOT DETECTED. The SARS-CoV-2 RNA is generally detectable in upper and lower respiratory specimens during the acute phase of infection. The lowest concentration of SARS-CoV-2 viral copies this assay can detect is 250 copies / mL. A negative result does not preclude SARS-CoV-2 infection and should not be used as the sole basis for treatment or other patient management decisions.  A negative result may occur with improper specimen collection / handling, submission of specimen other than nasopharyngeal  swab, presence of viral mutation(s) within the areas targeted by this assay, and inadequate number of viral copies (<250 copies / mL). A negative result must be combined with clinical observations, patient history, and epidemiological information. Fact Sheet for Patients:   StrictlyIdeas.no Fact Sheet for Healthcare Providers: BankingDealers.co.za This test is not yet approved or cleared  by the [place] FDA and has been authorized for detection and/or diagnosis of SARS-CoV-2 by FDA under an Emergency Use Authorization (EUA).  This EUA will remain in effect (meaning this test can be used) for the duration of the COVID-19 declaration under Section 564(b)(1) of the Act, 21 U.S.C. section 360bbb-3(b)(1), unless the authorization is terminated or revoked sooner. Performed at [facility], [address], [place], [place] [address]          Radiology Studies: DG Chest 2 View  Result Date: 01/26/2020 CLINICAL DATA:  Shortness of breath EXAM: CHEST - 2 VIEW COMPARISON:  By report from 01/25/2020 FINDINGS: Cardiac shadow is within normal limits. Left breast implant is noted. Left axillary node dissection is seen. The lungs are clear bilaterally. No focal infiltrate or effusion is seen. No acute bony abnormality is noted. IMPRESSION: No acute abnormality noted. Electronically Signed   By: Inez Catalina M.D.   On: 01/26/2020 15:13   US RENAL  Result Date: 01/26/2020 CLINICAL DATA:  Acute kidney injury EXAM: RENAL / URINARY TRACT ULTRASOUND COMPLETE COMPARISON:  None. FINDINGS: Right Kidney: Renal measurements: 12.2 x 6.2 x 5.9 cm = volume: 237 mL. Diffusely increased echotexture. No mass or hydronephrosis. Left Kidney: Renal measurements: 11.9 x 6.0 x 5.5 cm = volume: 203 mL. Diffusely increased echotexture. No mass or hydronephrosis. Bladder: Appears normal for degree of bladder distention. Other: None. IMPRESSION: No acute findings.   No hydronephrosis. Mildly increased echotexture throughout the kidneys suggesting chronic medical renal disease. Electronically Signed   By: Rolm Baptise M.D.   On: 01/26/2020 18:47   PERIPHERAL VASCULAR CATHETERIZATION  Result Date: 01/27/2020 See op note       Scheduled Meds: . amLODipine  5 mg Oral Daily  . vitamin C  1,000 mg Oral Daily  . aspirin EC  81 mg Oral Daily  .  B-complex with vitamin C  1 tablet Oral Daily  . calcium-vitamin D  1 tablet Oral Daily  . Chlorhexidine Gluconate Cloth  6 each Topical Daily  . Chlorhexidine Gluconate Cloth  6 each Topical Q0600  . feeding supplement  1 Container Oral TID BM  . heparin  5,000 Units Subcutaneous Q8H  . levothyroxine  88 mcg Oral Daily  . montelukast  10 mg Oral Daily  . multivitamin with minerals  1 tablet Oral Daily  . omega-3 acid ethyl esters  1 g Oral BID  . pantoprazole  40 mg Oral Daily  . vitamin E  400 Units Oral Daily   Continuous Infusions: . sodium chloride 100 mL/hr at 01/27/20 2144  . sodium chloride       LOS: 1 day    Time spent: 35 minutes    Edwin Dada, MD [facility] 01/28/2020, 12:41 PM     Please page though [place] or Epic secure chat:  For [employer], Adult nurse

## 2020-01-28 NOTE — Progress Notes (Signed)
Post HD  

## 2020-01-28 NOTE — Progress Notes (Signed)
JAZMYNN PHO  MRN: [id number]  DOB/AGE: 10-16-1945 74 y.o.  Primary Care Physician:Hedrick, Jeneen Rinks, MD  Admit date: 01/26/2020  Chief Complaint:  Chief Complaint  Patient presents with  . Abnormal Lab    S-Pt presented on  01/26/2020 with  Chief Complaint  Patient presents with  . Abnormal Lab  Patient seen on dialysis Patient tolerating treatment well  Medications . amLODipine  5 mg Oral Daily  . vitamin C  1,000 mg Oral Daily  . aspirin EC  81 mg Oral Daily  . B-complex with vitamin C  1 tablet Oral Daily  . calcium-vitamin D  1 tablet Oral Daily  . Chlorhexidine Gluconate Cloth  6 each Topical Daily  . Chlorhexidine Gluconate Cloth  6 each Topical Q0600  . feeding supplement  1 Container Oral TID BM  . heparin  5,000 Units Subcutaneous Q8H  . levothyroxine  88 mcg Oral Daily  . montelukast  10 mg Oral Daily  . multivitamin with minerals  1 tablet Oral Daily  . omega-3 acid ethyl esters  1 g Oral BID  . pantoprazole  40 mg Oral Daily  . vitamin E  400 Units Oral Daily         WKG:SUPJS from the symptoms mentioned above,there are no other symptoms referable to all systems reviewed.  Physical Exam: Vital signs in last 24 hours: Temp:  [97.4 F (36.3 C)-98.6 F (37 C)] 98 F (36.7 C) (05/13 0445) Pulse Rate:  [63-73] 63 (05/13 0445) Resp:  [16] 16 (05/13 0445) BP: (124-135)/(52-74) 134/74 (05/13 0445) SpO2:  [94 %-100 %] 98 % (05/13 0445) Weight change:  Last BM Date: 01/26/20  Intake/Output from previous day: 05/12 0701 - 05/13 0700 In: 872 [P.O.:596; I.V.:276] Out: 2420 [Urine:2420] No intake/output data recorded.   Physical Exam: General- pt is awake,alert, oriented to time place and person Resp- No acute REsp distress, CTA B/L NO Rhonchi CVS- S1S2 regular in rate and rhythm GIT- BS+, soft, NT, ND EXT- NO LE Edema, Cyanosis Access-Patient has temporary cath in the right femoral vein  Lab Results: CBC Recent Labs    01/27/20 0543  01/28/20 0505  WBC 8.7 7.1  HGB 8.6* 8.9*  HCT 25.9* 25.8*  PLT 253 245    BMET Recent Labs    01/27/20 0543 01/28/20 0505  NA 136 142  K 3.5 3.3*  CL 108 113*  CO2 17* 19*  GLUCOSE 78 85  BUN 69* 65*  CREATININE 8.04* 7.55*  CALCIUM 8.3* 8.3*    MICRO Recent Results (from the past 240 hour(s))  SARS Coronavirus 2 by RT PCR (hospital order, performed in [facility] hospital lab) Nasopharyngeal Nasopharyngeal Swab     Status: None   Collection Time: 01/26/20  3:54 PM   Specimen: Nasopharyngeal Swab  Result Value Ref Range Status   SARS Coronavirus 2 NEGATIVE NEGATIVE Final    Comment: (NOTE) SARS-CoV-2 target nucleic acids are NOT DETECTED. The SARS-CoV-2 RNA is generally detectable in upper and lower respiratory specimens during the acute phase of infection. The lowest concentration of SARS-CoV-2 viral copies this assay can detect is 250 copies / mL. A negative result does not preclude SARS-CoV-2 infection and should not be used as the sole basis for treatment or other patient management decisions.  A negative result may occur with improper specimen collection / handling, submission of specimen other than nasopharyngeal swab, presence of viral mutation(s) within the areas targeted by this assay, and inadequate number of viral copies (<250 copies /  mL). A negative result must be combined with clinical observations, patient history, and epidemiological information. Fact Sheet for Patients:   StrictlyIdeas.no Fact Sheet for Healthcare Providers: BankingDealers.co.za This test is not yet approved or cleared  by the [place] FDA and has been authorized for detection and/or diagnosis of SARS-CoV-2 by FDA under an Emergency Use Authorization (EUA).  This EUA will remain in effect (meaning this test can be used) for the duration of the COVID-19 declaration under Section 564(b)(1) of the Act, 21 U.S.C. section  360bbb-3(b)(1), unless the authorization is terminated or revoked sooner. Performed at [facility], [address], [place], [place] [address]       Lab Results  Component Value Date   CALCIUM 8.3 (L) 01/28/2020   PHOS 5.3 (H) 01/28/2020               Impression:   Patient is a 74 year old female with a past medical history of seasonal allergies, left breast cancer, depression, hyperlipidemia, hypertension, hypothyroidism, rheumatic fever, recent laryngeal ulcer who was admitted to the hospital on May 11 with chief complaint of severe acute kidney injury  1)Renal  AKI secondary to ATN Patient had acute kidney injury secondary to poor p.o. intake/ARB+hydrochlorothiazide on board Patient had severe AKI PC was placed yesterday Patient is having her first treatment today   2)HTN Blood pressure is stable Patient is on calcium channel blockers   3)Anemia of chronic disease  HGb at goal (9--11)   4) secondary hyperparathyroidism -CKD Mineral-Bone Disorder   Secondary Hyperparathyroidism  present . Phosphorus is not at goal. No need for binders for now  5) proteinuria Patient UA is positive for protein Patient spot protein creatinine ratio is pending  6) electrolytes   sodium Normonatremic   potassium Hypokalemia We will replete    7)Acid base Co2 not at goal  Patient has no anion gap acidosis Bicarb is improving   Plan:  Patient is currently being dialyzed Patient urine output is better We will continue to follow for need of renal placement therapy     Manpreet s [facility] 01/28/2020, 8:35 AM

## 2020-01-29 LAB — RENAL FUNCTION PANEL
Albumin: 2.8 g/dL — ABNORMAL LOW (ref 3.5–5.0)
Anion gap: 6 (ref 5–15)
BUN: 39 mg/dL — ABNORMAL HIGH (ref 8–23)
CO2: 24 mmol/L (ref 22–32)
Calcium: 8.3 mg/dL — ABNORMAL LOW (ref 8.9–10.3)
Chloride: 114 mmol/L — ABNORMAL HIGH (ref 98–111)
Creatinine, Ser: 5.32 mg/dL — ABNORMAL HIGH (ref 0.44–1.00)
GFR calc Af Amer: 9 mL/min — ABNORMAL LOW (ref >60)
GFR calc non Af Amer: 7 mL/min — ABNORMAL LOW (ref >60)
Glucose, Bld: 80 mg/dL (ref 70–99)
Phosphorus: 4.2 mg/dL (ref 2.5–4.6)
Potassium: 2.8 mmol/L — ABNORMAL LOW (ref 3.5–5.1)
Sodium: 144 mmol/L (ref 135–145)

## 2020-01-29 LAB — CBC
HCT: 24.9 % — ABNORMAL LOW (ref 36.0–46.0)
Hemoglobin: 8.5 g/dL — ABNORMAL LOW (ref 12.0–15.0)
MCH: 30.1 pg (ref 26.0–34.0)
MCHC: 34.1 g/dL (ref 30.0–36.0)
MCV: 88.3 fL (ref 80.0–100.0)
Platelets: 231 10*3/uL (ref 150–400)
RBC: 2.82 MIL/uL — ABNORMAL LOW (ref 3.87–5.11)
RDW: 12.2 % (ref 11.5–15.5)
WBC: 6 10*3/uL (ref 4.0–10.5)
nRBC: 0 % (ref 0.0–0.2)

## 2020-01-29 LAB — MPO/PR-3 (ANCA) ANTIBODIES
ANCA Proteinase 3: <3.5 U/mL (ref 0.0–3.5)
Myeloperoxidase Abs: <9 U/mL (ref 0.0–9.0)

## 2020-01-29 LAB — GLOMERULAR BASEMENT MEMBRANE ANTIBODIES: GBM Ab: 2 units (ref 0–20)

## 2020-01-29 LAB — GLUCOSE, CAPILLARY: Glucose-Capillary: 80 mg/dL (ref 70–99)

## 2020-01-29 LAB — C3 COMPLEMENT: C3 Complement: 119 mg/dL (ref 82–167)

## 2020-01-29 LAB — PARATHYROID HORMONE, INTACT (NO CA): PTH: 50 pg/mL (ref 15–65)

## 2020-01-29 LAB — C4 COMPLEMENT: Complement C4, Body Fluid: 21 mg/dL (ref 12–38)

## 2020-01-29 LAB — ANA W/REFLEX IF POSITIVE: Anti Nuclear Antibody (ANA): NEGATIVE

## 2020-01-29 MED ORDER — PRO-STAT SUGAR FREE PO LIQD
30.0000 mL | Freq: Three times a day (TID) | ORAL | Status: DC
  Administered 2020-01-29 – 2020-01-30 (×3): 30 mL via ORAL

## 2020-01-29 NOTE — Progress Notes (Signed)
PROGRESS NOTE    Michelle Lambert  JFH:[id number] DOB: 1946-04-10 DOA: 01/26/2020 PCP: Maryland Pink, MD      Brief Narrative:  Michelle Lambert is a 74 y.o. F with HTN, hypothyroidism and remote BrCa who presented with several weeks progressive generalized weakness, nausea, as well as acute renal failure.  Patient has started to develop generalized weakness, anorexia, and malaise several weeks ago, roughly around the time she was diagnosed with a laryngeal ulcer.  Then recently she was seen by her PCP who ordered a BMP which was abnormal and she was sent to the ER due to her acute kidney injury.  In the ER, creatinine 8, up from 5 two days prior (BUN 67, baseline creatinine last November 0.8).  Renal US unremarkable.  CXR clear.  UA with heavy leukocytes.          Assessment & Plan:  Acute renal failure Patient presented with several weeks symptoms, now Cr up to 5 as outpatient, 8 here.  History negative for NSAIDs (maybe once or twice per week), no recent contrast exposure.  Uses ARB and HCTZ.  Omeprazole associated with AIN and UA with many white cells.    BP good, again 2.9L UOP yesterday, underwent dialysis so bicarb normalized  -Trend UOP -Consult Nephrology, appreciate cares -Follow SPEP -Follow ANCA, GBM, complements, ANA   Sinusitis Mild. Symptoms of congestion, drainage and cough.  Had two doses of doxycycline prior to admission.   -Will defer treatment for now, follow clinically  Moderate protein calorie malnutrition -Consult nutrition  Hypokalemia K 2.8 today.  -Defer to dialysis  Hypertension Hyperlipidemia BP normal -Continue Crestor -Continue amlodipine -Hold candesartan/HCTZ  Hypothyroidism -Continue levothyroxine  GERD -Hold omeprazole -Continue pantoprazole  Anemia of renal failure hgb stable, no clinical bleeding        Disposition: Status is: Inpatient  Remains inpatient appropriate because: she still has severe renal failure  with unclear prognosis for recovery   Dispo: The patient is from: Home              Anticipated d/c is to: TBD              Anticipated d/c date is: Maybe 2 days              Patient currently is not medically stable to d/c.  The patient underwent dialysis, but is making very good urine.  Nephrology will monitor urine output and electrolytes, and determine the need for future dialysis on a day by day basis.  If her renal failure gradually improves and oral intake is adequate, possibly home in 2 days              MDM: The below labs and imaging reports reviewed and summarized above.  Medication management as above.       DVT prophylaxis: Heparin Code Status: FULL Family Communication: Husband    Consultants:   Nephrology  IR  Procedures:   5/12 temporary HD catheter insertion   5/13 first dialysis session  Antimicrobials:      Culture data:              Subjective: Nausea persists but is better with Phenergan.  No fever, confusion, dysuria, abdominal pain.  Her nasal congestion is unchanged.        Objective: Vitals:   01/28/20 1222 01/28/20 2005 01/29/20 0540 01/29/20 1135  BP: (!) 145/67 138/71 (!) 149/68 140/65  Pulse: 65 63 72 69  Resp: 16 18 18    Temp:  98.5 F (36.9 C) 98.4 F (36.9 C) 98.3 F (36.8 C) 98 F (36.7 C)  TempSrc: Oral Oral Oral Oral  SpO2: 99% 100% 98% 96%  Weight:      Height:        Intake/Output Summary (Last 24 hours) at 01/29/2020 1524 Last data filed at 01/29/2020 1449 Gross per 24 hour  Intake 2547.11 ml  Output 2950 ml  Net -402.89 ml   Filed Weights   01/26/20 1138 01/26/20 1957 01/28/20 0936  Weight: 65.8 kg 65.7 kg 65.7 kg    Examination: General appearance: Obese adult female, lying in bed, no acute distress, interactive.  Talking on the phone.     HEENT: Anicteric, conjunctival pink, lids lashes normal.  No nasal deformity, discharge, or epistaxis. Skin: No suspicious rashes or  lesions. Cardiac: Regular rate and rhythm, no murmurs, no lower extremity edema Respiratory: Normal respiratory rate and rhythm, lungs clear without rales or wheezes Abdomen: No tenderness palpation or guarding. MSK: Normal muscle bulk and tone Neuro: Extraocular movements intact, moves all extremities with normal strength and coordination, speech fluent. Psych: Attention normal, affect normal, judgment insight appeared normal.         Data Reviewed: I have personally reviewed following labs and imaging studies:  CBC: Recent Labs  Lab 01/26/20 1203 01/27/20 0543 01/28/20 0505 01/29/20 0539  WBC 11.4* 8.7 7.1 6.0  HGB 10.3* 8.6* 8.9* 8.5*  HCT 29.4* 25.9* 25.8* 24.9*  MCV 85.7 89.0 87.2 88.3  PLT 321 253 245 324   Basic Metabolic Panel: Recent Labs  Lab 01/26/20 1203 01/27/20 0543 01/28/20 0505 01/28/20 1020 01/29/20 0539  NA 135 136 142  --  144  K 3.8 3.5 3.3*  --  2.8*  CL 103 108 113*  --  114*  CO2 20* 17* 19*  --  24  GLUCOSE 106* 78 85  --  80  BUN 67* 69* 65*  --  39*  CREATININE 8.01* 8.04* 7.55*  --  5.32*  CALCIUM 9.1 8.3* 8.3*  --  8.3*  PHOS  --   --  5.3* 3.8 4.2   GFR: Estimated Creatinine Clearance: 8.5 mL/min (A) (by C-G formula based on SCr of 5.32 mg/dL (H)). Liver Function Tests: Recent Labs  Lab 01/26/20 1203 01/28/20 0505 01/29/20 0539  AST 15  --   --   ALT 11  --   --   ALKPHOS 55  --   --   BILITOT 1.0  --   --   PROT 7.8  --   --   ALBUMIN 3.4* 2.9* 2.8*   No results for input(s): LIPASE, AMYLASE in the last 168 hours. No results for input(s): AMMONIA in the last 168 hours. Coagulation Profile: No results for input(s): INR, PROTIME in the last 168 hours. Cardiac Enzymes: No results for input(s): CKTOTAL, CKMB, CKMBINDEX, TROPONINI in the last 168 hours. BNP (last 3 results) No results for input(s): PROBNP in the last 8760 hours. HbA1C: No results for input(s): HGBA1C in the last 72 hours. CBG: Recent Labs  Lab  01/27/20 0744 01/28/20 0743 01/29/20 0736  GLUCAP 73 78 80   Lipid Profile: No results for input(s): CHOL, HDL, LDLCALC, TRIG, CHOLHDL, LDLDIRECT in the last 72 hours. Thyroid Function Tests: No results for input(s): TSH, T4TOTAL, FREET4, T3FREE, THYROIDAB in the last 72 hours. Anemia Panel: No results for input(s): VITAMINB12, FOLATE, FERRITIN, TIBC, IRON, RETICCTPCT in the last 72 hours. Urine analysis:    Component Value Date/Time  COLORURINE YELLOW (A) 01/26/2020 1203   APPEARANCEUR CLOUDY (A) 01/26/2020 1203   LABSPEC 1.008 01/26/2020 1203   PHURINE 7.0 01/26/2020 1203   GLUCOSEU NEGATIVE 01/26/2020 1203   HGBUR SMALL (A) 01/26/2020 1203   BILIRUBINUR NEGATIVE 01/26/2020 1203   KETONESUR NEGATIVE 01/26/2020 1203   PROTEINUR 100 (A) 01/26/2020 1203   NITRITE NEGATIVE 01/26/2020 1203   LEUKOCYTESUR LARGE (A) 01/26/2020 1203   Sepsis Labs: @LABRCNTIP (procalcitonin:4,lacticacidven:4)  ) Recent Results (from the past 240 hour(s))  SARS Coronavirus 2 by RT PCR (hospital order, performed in [place] hospital lab) Nasopharyngeal Nasopharyngeal Swab     Status: None   Collection Time: 01/26/20  3:54 PM   Specimen: Nasopharyngeal Swab  Result Value Ref Range Status   SARS Coronavirus 2 NEGATIVE NEGATIVE Final    Comment: (NOTE) SARS-CoV-2 target nucleic acids are NOT DETECTED. The SARS-CoV-2 RNA is generally detectable in upper and lower respiratory specimens during the acute phase of infection. The lowest concentration of SARS-CoV-2 viral copies this assay can detect is 250 copies / mL. A negative result does not preclude SARS-CoV-2 infection and should not be used as the sole basis for treatment or other patient management decisions.  A negative result may occur with improper specimen collection / handling, submission of specimen other than nasopharyngeal swab, presence of viral mutation(s) within the areas targeted by this assay, and inadequate number of viral  copies (<250 copies / mL). A negative result must be combined with clinical observations, patient history, and epidemiological information. Fact Sheet for Patients:   StrictlyIdeas.no Fact Sheet for Healthcare Providers: BankingDealers.co.za This test is not yet approved or cleared  by the [place] FDA and has been authorized for detection and/or diagnosis of SARS-CoV-2 by FDA under an Emergency Use Authorization (EUA).  This EUA will remain in effect (meaning this test can be used) for the duration of the COVID-19 declaration under Section 564(b)(1) of the Act, 21 U.S.C. section 360bbb-3(b)(1), unless the authorization is terminated or revoked sooner. Performed at [facility], [address] [place], [place] [address]          Radiology Studies: PERIPHERAL VASCULAR CATHETERIZATION  Result Date: 01/27/2020 See op note       Scheduled Meds: . amLODipine  5 mg Oral Daily  . vitamin C  1,000 mg Oral Daily  . aspirin EC  81 mg Oral Daily  . B-complex with vitamin C  1 tablet Oral Daily  . calcium-vitamin D  1 tablet Oral Daily  . Chlorhexidine Gluconate Cloth  6 each Topical Daily  . Chlorhexidine Gluconate Cloth  6 each Topical Q0600  . feeding supplement (PRO-STAT SUGAR FREE 64)  30 mL Oral TID  . heparin  5,000 Units Subcutaneous Q8H  . levothyroxine  88 mcg Oral Daily  . montelukast  10 mg Oral Daily  . multivitamin with minerals  1 tablet Oral Daily  . omega-3 acid ethyl esters  1 g Oral BID  . pantoprazole  40 mg Oral Daily  . vitamin E  400 Units Oral Daily   Continuous Infusions: . sodium chloride 100 mL/hr at 01/29/20 1452  . sodium chloride       LOS: 2 days    Time spent: 25 minutes    Edwin Dada, MD [facility] 01/29/2020, 3:24 PM     Please page though [place] or Epic secure chat:  For [employer], Adult nurse

## 2020-01-29 NOTE — Progress Notes (Signed)
Nutrition Follow Up Note   DOCUMENTATION CODES:   Non-severe (moderate) malnutrition in context of social or environmental circumstances  INTERVENTION:   Prostat liquid protein PO 30 ml TID with meals, each supplement provides 100 kcal, 15 grams protein.  Magic cup TID with meals, each supplement provides 290 kcal and 9 grams of protein  MVI daily   B complex with C po daily   Pt at high refeed risk; recommend monitor K, Mg and P labs daily as oral intake improves.   NUTRITION DIAGNOSIS:   Moderate Malnutrition related to social / environmental circumstances(decreased appetite, nausea and vomiting) as evidenced by mild fat depletion, mild to moderate muscle depletion, 12 percent weight loss in 3 months.  GOAL:   Patient will meet greater than or equal to 90% of their needs  -not met   MONITOR:   PO intake, Supplement acceptance, Labs, Weight trends, Skin, I & O's  ASSESSMENT:   74 y.o. female with medical history significant of hypertension, hyperlipidemia, hypothyroidism, depression, breast cancer (s/p of mastectomy and chemotherapy 1989), who presents with abnormal lab and generalized weakness.   Spoke with pt via phone. Pt reports continued nausea and poor oral intake; pt ate 10% of her breakfast this morning. Pt reports post-prandial nausea; pt reports that she and nursing have developed a plan to give her nausea medicine before she eats to see if that will help. Pt has been unable to drink the [facility] as she reports that its too sweet. Pt does not believe that she will be able to tolerate milky supplements. RD discussed with pt the importance of adequate protein needed to preserve lean muscle. Pt is s/p temporary dialysis catheter and first HD treatment yesterday which will increase her protein needs even more. Pt was provided with multiple supplement options and would like to try Prostat. Pt already ordered for B-Complex with C and vitamin C so will hold off on  rena-vite. Per chart, pt has remained weight stable since admit.   If pt remains unable to eat or keep food down and unable to tolerate supplements, may need to consider post pyloric naso-gastric tube and nutrition support.    Medications reviewed and include: vitamin C, aspirin, B-complex with C, oscal w/ D, heparin, synthroid, MVI,  lovaza, protonix, vitamin E, NaCl _0 /hr  Labs reviewed: K 2.8(L), BUN 39(H), creat 5.32(H), P 4.2 wnl Hgb 8.5(L), Hct 24.9(L)  Diet Order:   Diet Order            Diet regular Room service appropriate? Yes; Fluid consistency: Thin  Diet effective now             EDUCATION NEEDS:   Education needs have been addressed  Skin:  Skin Assessment: Reviewed RN Assessment  Last BM:  5/11  Height:   Ht Readings from Last 1 Encounters:  01/26/20 _1  (1.575 m)    Weight:   Wt Readings from Last 1 Encounters:  01/28/20 65.7 kg    Ideal Body Weight:  50 kg  BMI:  Body mass index is 26.49 kg/m.  Estimated Nutritional Needs:   Kcal:  1500-1700kcal/day  Protein:  75-85g/day  Fluid:  >1.3L/day  Koleen Distance MS, RD, LDN Please refer to [facility] for RD and/or RD on-call/weekend/after hours pager

## 2020-01-29 NOTE — Progress Notes (Signed)
[facility]  ROUNDING NOTE   Subjective:  Patient seen at bedside. Urine outpt greatly improved.  Cr down to 5.3, UOP 2.9 liters over the preceding 24 hours.    Objective:  Vital signs in last 24 hours:  Temp:  [98 F (36.7 C)-98.4 F (36.9 C)] 98 F (36.7 C) (05/14 1135) Pulse Rate:  [63-72] 69 (05/14 1135) Resp:  [18] 18 (05/14 0540) BP: (138-149)/(65-71) 140/65 (05/14 1135) SpO2:  [96 %-100 %] 96 % (05/14 1135)  Weight change:  Filed Weights   01/26/20 1138 01/26/20 1957 01/28/20 0936  Weight: 65.8 kg 65.7 kg 65.7 kg    Intake/Output: I/O last 3 completed shifts: In: 2724.7 [P.O.:420; I.V.:2304.7] Out: 4175 [Urine:4175]   Intake/Output this shift:  Total I/O In: 1318.5 [P.O.:420; I.V.:898.5] Out: 1200 [Urine:1200]  Physical Exam: General: No acute distress  Head: Normocephalic, atraumatic. Moist oral mucosal membranes  Eyes: Anicteric  Neck: Supple, trachea midline  Lungs:  Clear to auscultation, normal effort  Heart: S1S2 no rubs  Abdomen:  Soft, nontender, bowel sounds present  Extremities: Trace peripheral edema.  Neurologic: Awake, alert, following commands  Skin: No lesions  Access: Right femoral dialysis catheter    Basic Metabolic Panel: Recent Labs  Lab 01/26/20 1203 01/26/20 1203 01/27/20 0543 01/28/20 0505 01/28/20 1020 01/29/20 0539  NA 135  --  136 142  --  144  K 3.8  --  3.5 3.3*  --  2.8*  CL 103  --  108 113*  --  114*  CO2 20*  --  17* 19*  --  24  GLUCOSE 106*  --  78 85  --  80  BUN 67*  --  69* 65*  --  39*  CREATININE 8.01*  --  8.04* 7.55*  --  5.32*  CALCIUM 9.1   < > 8.3* 8.3*  --  8.3*  PHOS  --   --   --  5.3* 3.8 4.2   < > = values in this interval not displayed.    Liver Function Tests: Recent Labs  Lab 01/26/20 1203 01/28/20 0505 01/29/20 0539  AST 15  --   --   ALT 11  --   --   ALKPHOS 55  --   --   BILITOT 1.0  --   --   PROT 7.8  --   --   ALBUMIN 3.4* 2.9* 2.8*   No results for  input(s): LIPASE, AMYLASE in the last 168 hours. No results for input(s): AMMONIA in the last 168 hours.  CBC: Recent Labs  Lab 01/26/20 1203 01/27/20 0543 01/28/20 0505 01/29/20 0539  WBC 11.4* 8.7 7.1 6.0  HGB 10.3* 8.6* 8.9* 8.5*  HCT 29.4* 25.9* 25.8* 24.9*  MCV 85.7 89.0 87.2 88.3  PLT 321 253 245 231    Cardiac Enzymes: No results for input(s): CKTOTAL, CKMB, CKMBINDEX, TROPONINI in the last 168 hours.  BNP: Invalid input(s): POCBNP  CBG: Recent Labs  Lab 01/27/20 0744 01/28/20 0743 01/29/20 0736  GLUCAP 73 78 80    Microbiology: Results for orders placed or performed during the hospital encounter of 01/26/20  SARS Coronavirus 2 by RT PCR (hospital order, performed in [facility] hospital lab) Nasopharyngeal Nasopharyngeal Swab     Status: None   Collection Time: 01/26/20  3:54 PM   Specimen: Nasopharyngeal Swab  Result Value Ref Range Status   SARS Coronavirus 2 NEGATIVE NEGATIVE Final    Comment: (NOTE) SARS-CoV-2 target nucleic acids are NOT DETECTED.  The SARS-CoV-2 RNA is generally detectable in upper and lower respiratory specimens during the acute phase of infection. The lowest concentration of SARS-CoV-2 viral copies this assay can detect is 250 copies / mL. A negative result does not preclude SARS-CoV-2 infection and should not be used as the sole basis for treatment or other patient management decisions.  A negative result may occur with improper specimen collection / handling, submission of specimen other than nasopharyngeal swab, presence of viral mutation(s) within the areas targeted by this assay, and inadequate number of viral copies (<250 copies / mL). A negative result must be combined with clinical observations, patient history, and epidemiological information. Fact Sheet for Patients:   StrictlyIdeas.no Fact Sheet for Healthcare Providers: BankingDealers.co.za This test is not yet approved or  cleared  by the [place] FDA and has been authorized for detection and/or diagnosis of SARS-CoV-2 by FDA under an Emergency Use Authorization (EUA).  This EUA will remain in effect (meaning this test can be used) for the duration of the COVID-19 declaration under Section 564(b)(1) of the Act, 21 U.S.C. section 360bbb-3(b)(1), unless the authorization is terminated or revoked sooner. Performed at [facility], [address] [place], [place] [address]     Coagulation Studies: No results for input(s): LABPROT, INR in the last 72 hours.  Urinalysis: No results for input(s): COLORURINE, LABSPEC, PHURINE, GLUCOSEU, HGBUR, BILIRUBINUR, KETONESUR, PROTEINUR, UROBILINOGEN, NITRITE, LEUKOCYTESUR in the last 72 hours.  Invalid input(s): APPERANCEUR    Imaging: PERIPHERAL VASCULAR CATHETERIZATION  Result Date: 01/27/2020 See op note    Medications:   . sodium chloride 100 mL/hr at 01/29/20 1452  . sodium chloride     . amLODipine  5 mg Oral Daily  . vitamin C  1,000 mg Oral Daily  . aspirin EC  81 mg Oral Daily  . B-complex with vitamin C  1 tablet Oral Daily  . calcium-vitamin D  1 tablet Oral Daily  . Chlorhexidine Gluconate Cloth  6 each Topical Daily  . Chlorhexidine Gluconate Cloth  6 each Topical Q0600  . feeding supplement (PRO-STAT SUGAR FREE 64)  30 mL Oral TID  . heparin  5,000 Units Subcutaneous Q8H  . levothyroxine  88 mcg Oral Daily  . montelukast  10 mg Oral Daily  . multivitamin with minerals  1 tablet Oral Daily  . omega-3 acid ethyl esters  1 g Oral BID  . pantoprazole  40 mg Oral Daily  . vitamin E  400 Units Oral Daily   acetaminophen, hydrALAZINE, ondansetron (ZOFRAN) IV, oxymetazoline, promethazine **OR** promethazine **OR** promethazine, sodium chloride  Assessment/ Plan:  74 y.o. female with a PMHx of seasonal allergies, left breast cancer, depression, hyperlipidemia, hypertension, hypothyroidism, rheumatic fever, hypothyroidism, recent  laryngeal ulcer, who was admitted to [facility] on 01/26/2020 for evaluation of severe acute kidney injury.   1.  Acute kidney injury, differential diagnosis extensive but suspect prolonged dehydration as a result of laryngeal ulcer. 2.  Microscopic hematuria. 3.  Proteinuria. 4.  Metabolic acidosis. 5.  Anemia unspecified.  Plan:   Patient seen and evaluated at bedside. Urine output was good over the preceding 24 hours at 2.9.  Creatinine down to 5.3.  Therefore we will hold off on further dialysis treatments.  Consider removing temporary dialysis catheter tomorrow.  We are still awaiting further serologic work-up.  ANA and GBM's were negative.  Still awaiting ANCA antibodies.  Patient still nauseous but is receiving as needed Zofran.  Encourage patient to continue to drink fluid on her  own.  Continue patient on saline drip for now as well.  LOS: 2 Miyoko Hashimi 5/14/20213:14 PM

## 2020-01-29 NOTE — Care Management Important Message (Signed)
Important Message  Patient Details  Name: Michelle Lambert MRN: [id number] Date of Birth: 1945/11/11   Medicare Important Message Given:  Yes     [name] 01/29/2020, 1:36 PM

## 2020-01-30 LAB — RENAL FUNCTION PANEL
Albumin: 2.7 g/dL — ABNORMAL LOW (ref 3.5–5.0)
Anion gap: 10 (ref 5–15)
BUN: 33 mg/dL — ABNORMAL HIGH (ref 8–23)
CO2: 23 mmol/L (ref 22–32)
Calcium: 8.1 mg/dL — ABNORMAL LOW (ref 8.9–10.3)
Chloride: 111 mmol/L (ref 98–111)
Creatinine, Ser: 4.56 mg/dL — ABNORMAL HIGH (ref 0.44–1.00)
GFR calc Af Amer: 10 mL/min — ABNORMAL LOW (ref >60)
GFR calc non Af Amer: 9 mL/min — ABNORMAL LOW (ref >60)
Glucose, Bld: 89 mg/dL (ref 70–99)
Phosphorus: 3.9 mg/dL (ref 2.5–4.6)
Potassium: 2.5 mmol/L — CL (ref 3.5–5.1)
Sodium: 144 mmol/L (ref 135–145)

## 2020-01-30 LAB — CBC
HCT: 23.7 % — ABNORMAL LOW (ref 36.0–46.0)
Hemoglobin: 8.4 g/dL — ABNORMAL LOW (ref 12.0–15.0)
MCH: 30.1 pg (ref 26.0–34.0)
MCHC: 35.4 g/dL (ref 30.0–36.0)
MCV: 84.9 fL (ref 80.0–100.0)
Platelets: 142 10*3/uL — ABNORMAL LOW (ref 150–400)
RBC: 2.79 MIL/uL — ABNORMAL LOW (ref 3.87–5.11)
RDW: 12.4 % (ref 11.5–15.5)
WBC: 6 10*3/uL (ref 4.0–10.5)
nRBC: 0 % (ref 0.0–0.2)

## 2020-01-30 LAB — GLUCOSE, CAPILLARY: Glucose-Capillary: 76 mg/dL (ref 70–99)

## 2020-01-30 LAB — MAGNESIUM: Magnesium: 1.5 mg/dL — ABNORMAL LOW (ref 1.7–2.4)

## 2020-01-30 MED ORDER — SODIUM CHLORIDE 0.9 % IV SOLN
INTRAVENOUS | Status: DC | PRN
  Administered 2020-01-30: 250 mL via INTRAVENOUS

## 2020-01-30 MED ORDER — POTASSIUM CHLORIDE 10 MEQ/100ML IV SOLN
10.0000 meq | Freq: Once | INTRAVENOUS | Status: AC
  Administered 2020-01-30: 10 meq via INTRAVENOUS
  Filled 2020-01-30: qty 100

## 2020-01-30 MED ORDER — MAGNESIUM SULFATE 2 GM/50ML IV SOLN
2.0000 g | Freq: Once | INTRAVENOUS | Status: AC
  Administered 2020-01-30: 2 g via INTRAVENOUS
  Filled 2020-01-30: qty 50

## 2020-01-30 MED ORDER — POTASSIUM CHLORIDE 10 MEQ/100ML IV SOLN
10.0000 meq | INTRAVENOUS | Status: DC
  Administered 2020-01-30 (×3): 10 meq via INTRAVENOUS
  Filled 2020-01-30 (×3): qty 100

## 2020-01-30 NOTE — Progress Notes (Signed)
PROGRESS NOTE    Michelle Lambert  QPR:[id number] DOB: 12-06-1945 DOA: 01/26/2020 PCP: [place] Pink, MD      Brief Narrative:  Michelle Lambert is a 74 y.o. F with HTN, hypothyroidism and remote BrCa who presented with several weeks progressive generalized weakness, nausea, as well as acute renal failure.  Patient has started to develop generalized weakness, anorexia, and malaise several weeks ago, roughly around the time she was diagnosed with a laryngeal ulcer.  Then recently she was seen by her PCP who ordered a BMP which was abnormal and she was sent to the ER due to her acute kidney injury.  In the ER, creatinine 8, up from 5 two days prior (BUN 67, baseline creatinine last November 0.8).  Renal US unremarkable.  CXR clear.  UA with heavy leukocytes.          Assessment & Plan:  Acute renal failure Patient presented with several weeks symptoms, now Cr up to 5 as outpatient, 8 here.  History negative for NSAIDs (maybe once or twice per week), no recent contrast exposure.  Uses ARB and HCTZ.  Omeprazole associated with AIN and UA with many white cells.    Excellent urine output, creatinine down to the fours.  ANCA negative, anti-GBM negative, complements normal ANA negative -Trend UOP -Consult Nephrology, appreciate cares -Follow SPEP     Sinusitis Mild. Symptoms of congestion, drainage and cough.  Had two doses of doxycycline prior to admission.   -Will defer treatment for now, follow clinically  Moderate protein calorie malnutrition -Consult nutrition  Hypokalemia Potassium down to 2.5 today -Supplement K  Hypertension Hyperlipidemia BP mildly elevated -Continue Crestor -Continue amlodipine -Hold candesartan/HCTZ  Hypothyroidism -Continue levothyroxine  GERD -Hold omeprazole -Continue pantoprazole  Anemia of renal failure hgb stable, no clinical bleeding        Disposition: Status is: Inpatient  Remains inpatient appropriate because: She has  severe renal failure, and unclear oral intake   Dispo: The patient is from: Home              Anticipated d/c is to: TBD              Anticipated d/c date is: Possibly tomorrow              Patient currently is not medically stable to d/c.  Diminished regions renal function improved to be improving. However she is still got severe nausea, unable to tolerate anything by mouth. We will continue IV fluids, monitor oral intake over the next 24 hours.                MDM: The below labs and imaging reports reviewed and summarized above.  Medication management as above.       DVT prophylaxis: Heparin Code Status: FULL Family Communication: Husband    Consultants:   Nephrology  IR  Procedures:   5/12 temporary HD catheter insertion   5/13 first dialysis session  Antimicrobials:      Culture data:              Subjective: Vomited after lunch today. Not able to keep anything down by mouth. No fever, confusion.        Objective: Vitals:   01/29/20 1135 01/29/20 1946 01/30/20 0501 01/30/20 1217  BP: 140/65 (!) 149/58 (!) 148/63 (!) 163/72  Pulse: 69 69 73 75  Resp:  _0 Temp: 98 F (36.7 C) 98.1 F (36.7 C) 98 F (36.7 C) 98.1 F (36.7 C)  TempSrc: Oral Oral Oral Oral  SpO2: 96% 98% 94% 98%  Weight:      Height:        Intake/Output Summary (Last 24 hours) at 01/30/2020 1322 Last data filed at 01/30/2020 1106 Gross per 24 hour  Intake 3032.96 ml  Output 4450 ml  Net -1417.04 ml   Filed Weights   01/26/20 1138 01/26/20 1957 01/28/20 0936  Weight: 65.8 kg 65.7 kg 65.7 kg    Examination: General appearance: Obese adult female, lying in bed, no acute distress, interactive.  Talking on the phone.     HEENT: Anicteric, conjunctival pink, lids lashes normal.  No nasal deformity, discharge, or epistaxis. Skin: No suspicious rashes or lesions. Cardiac: Regular rate and rhythm, no murmurs, no lower extremity edema Respiratory:  Normal respiratory rate and rhythm, lungs clear without rales or wheezes Abdomen: No tenderness palpation or guarding. MSK: Normal muscle bulk and tone Neuro: Extraocular movements intact, moves all extremities with normal strength and coordination, speech fluent. Psych: Attention normal, affect normal, judgment insight appeared normal.         Data Reviewed: I have personally reviewed following labs and imaging studies:  CBC: Recent Labs  Lab 01/26/20 1203 01/27/20 0543 01/28/20 0505 01/29/20 0539 01/30/20 0553  WBC 11.4* 8.7 7.1 6.0 6.0  HGB 10.3* 8.6* 8.9* 8.5* 8.4*  HCT 29.4* 25.9* 25.8* 24.9* 23.7*  MCV 85.7 89.0 87.2 88.3 84.9  PLT 321 253 245 231 811*   Basic Metabolic Panel: Recent Labs  Lab 01/26/20 1203 01/27/20 0543 01/28/20 0505 01/28/20 1020 01/29/20 0539 01/30/20 0553  NA 135 136 142  --  144 144  K 3.8 3.5 3.3*  --  2.8* 2.5*  CL 103 108 113*  --  114* 111  CO2 20* 17* 19*  --  24 23  GLUCOSE 106* 78 85  --  80 89  BUN 67* 69* 65*  --  39* 33*  CREATININE 8.01* 8.04* 7.55*  --  5.32* 4.56*  CALCIUM 9.1 8.3* 8.3*  --  8.3* 8.1*  MG  --   --   --   --   --  1.5*  PHOS  --   --  5.3* 3.8 4.2 3.9   GFR: Estimated Creatinine Clearance: 9.9 mL/min (A) (by C-G formula based on SCr of 4.56 mg/dL (H)). Liver Function Tests: Recent Labs  Lab 01/26/20 1203 01/28/20 0505 01/29/20 0539 01/30/20 0553  AST 15  --   --   --   ALT 11  --   --   --   ALKPHOS 55  --   --   --   BILITOT 1.0  --   --   --   PROT 7.8  --   --   --   ALBUMIN 3.4* 2.9* 2.8* 2.7*   No results for input(s): LIPASE, AMYLASE in the last 168 hours. No results for input(s): AMMONIA in the last 168 hours. Coagulation Profile: No results for input(s): INR, PROTIME in the last 168 hours. Cardiac Enzymes: No results for input(s): CKTOTAL, CKMB, CKMBINDEX, TROPONINI in the last 168 hours. BNP (last 3 results) No results for input(s): PROBNP in the last 8760 hours. HbA1C: No  results for input(s): HGBA1C in the last 72 hours. CBG: Recent Labs  Lab 01/27/20 0744 01/28/20 0743 01/29/20 0736 01/30/20 0834  GLUCAP 73 78 80 76   Lipid Profile: No results for input(s): CHOL, HDL, LDLCALC, TRIG, CHOLHDL, LDLDIRECT in the last 72 hours. Thyroid Function Tests: No  results for input(s): TSH, T4TOTAL, FREET4, T3FREE, THYROIDAB in the last 72 hours. Anemia Panel: No results for input(s): VITAMINB12, FOLATE, FERRITIN, TIBC, IRON, RETICCTPCT in the last 72 hours. Urine analysis:    Component Value Date/Time   COLORURINE YELLOW (A) 01/26/2020 1203   APPEARANCEUR CLOUDY (A) 01/26/2020 1203   LABSPEC 1.008 01/26/2020 1203   PHURINE 7.0 01/26/2020 1203   GLUCOSEU NEGATIVE 01/26/2020 1203   HGBUR SMALL (A) 01/26/2020 1203   BILIRUBINUR NEGATIVE 01/26/2020 1203   KETONESUR NEGATIVE 01/26/2020 1203   PROTEINUR 100 (A) 01/26/2020 1203   NITRITE NEGATIVE 01/26/2020 1203   LEUKOCYTESUR LARGE (A) 01/26/2020 1203   Sepsis Labs: _0 (procalcitonin:4,lacticacidven:4)  ) Recent Results (from the past 240 hour(s))  SARS Coronavirus 2 by RT PCR (hospital order, performed in [place] hospital lab) Nasopharyngeal Nasopharyngeal Swab     Status: None   Collection Time: 01/26/20  3:54 PM   Specimen: Nasopharyngeal Swab  Result Value Ref Range Status   SARS Coronavirus 2 NEGATIVE NEGATIVE Final    Comment: (NOTE) SARS-CoV-2 target nucleic acids are NOT DETECTED. The SARS-CoV-2 RNA is generally detectable in upper and lower respiratory specimens during the acute phase of infection. The lowest concentration of SARS-CoV-2 viral copies this assay can detect is 250 copies / mL. A negative result does not preclude SARS-CoV-2 infection and should not be used as the sole basis for treatment or other patient management decisions.  A negative result may occur with improper specimen collection / handling, submission of specimen other than nasopharyngeal swab, presence of  viral mutation(s) within the areas targeted by this assay, and inadequate number of viral copies (<250 copies / mL). A negative result must be combined with clinical observations, patient history, and epidemiological information. Fact Sheet for Patients:   StrictlyIdeas.no Fact Sheet for Healthcare Providers: BankingDealers.co.za This test is not yet approved or cleared  by the [place] FDA and has been authorized for detection and/or diagnosis of SARS-CoV-2 by FDA under an Emergency Use Authorization (EUA).  This EUA will remain in effect (meaning this test can be used) for the duration of the COVID-19 declaration under Section 564(b)(1) of the Act, 21 U.S.C. section 360bbb-3(b)(1), unless the authorization is terminated or revoked sooner. Performed at [facility], [address], [place], [place] [address]          Radiology Studies: No results found.      Scheduled Meds: . amLODipine  5 mg Oral Daily  . vitamin C  1,000 mg Oral Daily  . aspirin EC  81 mg Oral Daily  . B-complex with vitamin C  1 tablet Oral Daily  . calcium-vitamin D  1 tablet Oral Daily  . Chlorhexidine Gluconate Cloth  6 each Topical Daily  . Chlorhexidine Gluconate Cloth  6 each Topical Q0600  . feeding supplement (PRO-STAT SUGAR FREE 64)  30 mL Oral TID  . heparin  5,000 Units Subcutaneous Q8H  . levothyroxine  88 mcg Oral Daily  . montelukast  10 mg Oral Daily  . multivitamin with minerals  1 tablet Oral Daily  . omega-3 acid ethyl esters  1 g Oral BID  . pantoprazole  40 mg Oral Daily  . vitamin E  400 Units Oral Daily   Continuous Infusions: . sodium chloride 100 mL/hr at 01/30/20 1058  . sodium chloride 10 mL/hr at 01/30/20 1058  . potassium chloride    . sodium chloride       LOS: 3 days    Time spent:  25 minutes    Edwin Dada, MD [facility] 01/30/2020, 1:22 PM     Please page though [place] or  Epic secure chat:  For [employer], Adult nurse

## 2020-01-30 NOTE — Plan of Care (Signed)
Continuing with plan of care. 

## 2020-01-30 NOTE — Progress Notes (Signed)
[facility]  ROUNDING NOTE   Subjective:   Patient vomited again this morning.   Creatinine 4.56 (5.32) UOP 3734mL.  K 2.5 - IV replacement  NS at 140mL/hr  Objective:  Vital signs in last 24 hours:  Temp:  [98 F (36.7 C)-98.1 F (36.7 C)] 98.1 F (36.7 C) (05/15 1217) Pulse Rate:  [69-75] 75 (05/15 1217) Resp:  [14-16] 14 (05/15 1217) BP: (148-163)/(58-72) 163/72 (05/15 1217) SpO2:  [94 %-98 %] 98 % (05/15 1217)  Weight change:  Filed Weights   01/26/20 1138 01/26/20 1957 01/28/20 0936  Weight: 65.8 kg 65.7 kg 65.7 kg    Intake/Output: I/O last 3 completed shifts: In: 3221.1 [P.O.:420; I.V.:2801.1] Out: 5500 [Urine:5500]   Intake/Output this shift:  Total I/O In: 1529.9 [I.V.:1179.8; IV Piggyback:350] Out: 1400 [Urine:1400]  Physical Exam: General: No acute distress  Head: Normocephalic, atraumatic. Moist oral mucosal membranes  Eyes: Anicteric  Neck: Supple, trachea midline  Lungs:  Clear to auscultation, normal effort  Heart: regular  Abdomen:  Soft, nontender, bowel sounds present  Extremities: No peripheral edema.  Neurologic: Awake, alert, following commands  Skin: No lesions  Access: Right femoral dialysis catheter    Basic Metabolic Panel: Recent Labs  Lab 01/26/20 1203 01/26/20 1203 01/27/20 0543 01/27/20 0543 01/28/20 0505 01/28/20 1020 01/29/20 0539 01/30/20 0553  NA 135  --  136  --  142  --  144 144  K 3.8  --  3.5  --  3.3*  --  2.8* 2.5*  CL 103  --  108  --  113*  --  114* 111  CO2 20*  --  17*  --  19*  --  24 23  GLUCOSE 106*  --  78  --  85  --  80 89  BUN 67*  --  69*  --  65*  --  39* 33*  CREATININE 8.01*  --  8.04*  --  7.55*  --  5.32* 4.56*  CALCIUM 9.1   < > 8.3*   < > 8.3*  --  8.3* 8.1*  MG  --   --   --   --   --   --   --  1.5*  PHOS  --   --   --   --  5.3* 3.8 4.2 3.9   < > = values in this interval not displayed.    Liver Function Tests: Recent Labs  Lab 01/26/20 1203 01/28/20 0505  01/29/20 0539 01/30/20 0553  AST 15  --   --   --   ALT 11  --   --   --   ALKPHOS 55  --   --   --   BILITOT 1.0  --   --   --   PROT 7.8  --   --   --   ALBUMIN 3.4* 2.9* 2.8* 2.7*   No results for input(s): LIPASE, AMYLASE in the last 168 hours. No results for input(s): AMMONIA in the last 168 hours.  CBC: Recent Labs  Lab 01/26/20 1203 01/27/20 0543 01/28/20 0505 01/29/20 0539 01/30/20 0553  WBC 11.4* 8.7 7.1 6.0 6.0  HGB 10.3* 8.6* 8.9* 8.5* 8.4*  HCT 29.4* 25.9* 25.8* 24.9* 23.7*  MCV 85.7 89.0 87.2 88.3 84.9  PLT 321 253 245 231 142*    Cardiac Enzymes: No results for input(s): CKTOTAL, CKMB, CKMBINDEX, TROPONINI in the last 168 hours.  BNP: Invalid input(s): POCBNP  CBG: Recent Labs  Lab 01/27/20  3785 01/28/20 0743 01/29/20 0736 01/30/20 0834  GLUCAP 73 78 80 76    Microbiology: Results for orders placed or performed during the hospital encounter of 01/26/20  SARS Coronavirus 2 by RT PCR (hospital order, performed in [facility] hospital lab) Nasopharyngeal Nasopharyngeal Swab     Status: None   Collection Time: 01/26/20  3:54 PM   Specimen: Nasopharyngeal Swab  Result Value Ref Range Status   SARS Coronavirus 2 NEGATIVE NEGATIVE Final    Comment: (NOTE) SARS-CoV-2 target nucleic acids are NOT DETECTED. The SARS-CoV-2 RNA is generally detectable in upper and lower respiratory specimens during the acute phase of infection. The lowest concentration of SARS-CoV-2 viral copies this assay can detect is 250 copies / mL. A negative result does not preclude SARS-CoV-2 infection and should not be used as the sole basis for treatment or other patient management decisions.  A negative result may occur with improper specimen collection / handling, submission of specimen other than nasopharyngeal swab, presence of viral mutation(s) within the areas targeted by this assay, and inadequate number of viral copies (<250 copies / mL). A negative result must be  combined with clinical observations, patient history, and epidemiological information. Fact Sheet for Patients:   StrictlyIdeas.no Fact Sheet for Healthcare Providers: BankingDealers.co.za This test is not yet approved or cleared  by the [place] FDA and has been authorized for detection and/or diagnosis of SARS-CoV-2 by FDA under an Emergency Use Authorization (EUA).  This EUA will remain in effect (meaning this test can be used) for the duration of the COVID-19 declaration under Section 564(b)(1) of the Act, 21 U.S.C. section 360bbb-3(b)(1), unless the authorization is terminated or revoked sooner. Performed at [facility], [address] [place], [place] [address]     Coagulation Studies: No results for input(s): LABPROT, INR in the last 72 hours.  Urinalysis: No results for input(s): COLORURINE, LABSPEC, PHURINE, GLUCOSEU, HGBUR, BILIRUBINUR, KETONESUR, PROTEINUR, UROBILINOGEN, NITRITE, LEUKOCYTESUR in the last 72 hours.  Invalid input(s): APPERANCEUR    Imaging: No results found.   Medications:   . sodium chloride 100 mL/hr at 01/30/20 1300  . sodium chloride 10 mL/hr at 01/30/20 1300  . sodium chloride     . amLODipine  5 mg Oral Daily  . vitamin C  1,000 mg Oral Daily  . aspirin EC  81 mg Oral Daily  . B-complex with vitamin C  1 tablet Oral Daily  . calcium-vitamin D  1 tablet Oral Daily  . Chlorhexidine Gluconate Cloth  6 each Topical Daily  . Chlorhexidine Gluconate Cloth  6 each Topical Q0600  . feeding supplement (PRO-STAT SUGAR FREE 64)  30 mL Oral TID  . heparin  5,000 Units Subcutaneous Q8H  . levothyroxine  88 mcg Oral Daily  . montelukast  10 mg Oral Daily  . multivitamin with minerals  1 tablet Oral Daily  . omega-3 acid ethyl esters  1 g Oral BID  . pantoprazole  40 mg Oral Daily  . vitamin E  400 Units Oral Daily   sodium chloride, acetaminophen, hydrALAZINE, ondansetron (ZOFRAN)  IV, oxymetazoline, promethazine **OR** promethazine **OR** promethazine, sodium chloride  Assessment/ Plan:  74 y.o.white female with seasonal allergies, left breast cancer, depression, hyperlipidemia, hypertension, hypothyroidism, rheumatic fever, hypothyroidism, recent laryngeal ulcer, who was admitted to [facility] on 01/26/2020 for AKI (acute kidney injury) ([place]) [N17.9] Acute renal failure (ARF) ([place]) [N17.9]   1.  Acute kidney injury: required hemodialysis treatment. Baseline creatinine of 0.8 on 07/2019 2.  Microscopic  hematuria. 3.  Proteinuria. 4.  Metabolic acidosis. 5.  Anemia with renal failure. 6. Hypokalemia  Plan:   Secondary to prerenal azotemia from poor PO intake and vomiting from laryngeal ulcer.  Nonoliguric urine output. Creatinine and renal function improving. No indication for dialysis at this time.  Serologic work up negative so far.  Hypokalemia secondary to post ATN diuresis - Continue IV fluids - Remove hemodialysis catheter    LOS: 3 Sarath Kolluru 5/15/20213:53 PM

## 2020-01-31 ENCOUNTER — Inpatient Hospital Stay: Payer: PPO

## 2020-01-31 LAB — RENAL FUNCTION PANEL
Albumin: 3 g/dL — ABNORMAL LOW (ref 3.5–5.0)
Anion gap: 10 (ref 5–15)
BUN: 31 mg/dL — ABNORMAL HIGH (ref 8–23)
CO2: 25 mmol/L (ref 22–32)
Calcium: 8.7 mg/dL — ABNORMAL LOW (ref 8.9–10.3)
Chloride: 108 mmol/L (ref 98–111)
Creatinine, Ser: 3.77 mg/dL — ABNORMAL HIGH (ref 0.44–1.00)
GFR calc Af Amer: 13 mL/min — ABNORMAL LOW (ref >60)
GFR calc non Af Amer: 11 mL/min — ABNORMAL LOW (ref >60)
Glucose, Bld: 82 mg/dL (ref 70–99)
Phosphorus: 3.8 mg/dL (ref 2.5–4.6)
Potassium: 2.4 mmol/L — CL (ref 3.5–5.1)
Sodium: 143 mmol/L (ref 135–145)

## 2020-01-31 LAB — GLUCOSE, CAPILLARY: Glucose-Capillary: 70 mg/dL (ref 70–99)

## 2020-01-31 MED ORDER — ENOXAPARIN SODIUM 30 MG/0.3ML ~~LOC~~ SOLN
30.0000 mg | SUBCUTANEOUS | Status: DC
  Administered 2020-01-31 – 2020-02-01 (×2): 30 mg via SUBCUTANEOUS
  Filled 2020-01-31 (×2): qty 0.3

## 2020-01-31 MED ORDER — POTASSIUM CHLORIDE IN NACL 40-0.9 MEQ/L-% IV SOLN
INTRAVENOUS | Status: AC
  Administered 2020-01-31: 100 mL/h via INTRAVENOUS
  Filled 2020-01-31: qty 1000

## 2020-01-31 MED ORDER — MAGNESIUM SULFATE 2 GM/50ML IV SOLN
2.0000 g | Freq: Once | INTRAVENOUS | Status: AC
  Administered 2020-01-31: 2 g via INTRAVENOUS
  Filled 2020-01-31: qty 50

## 2020-01-31 MED ORDER — POTASSIUM CHLORIDE 10 MEQ/100ML IV SOLN
10.0000 meq | INTRAVENOUS | Status: DC
  Filled 2020-01-31: qty 100

## 2020-01-31 MED ORDER — POTASSIUM CHLORIDE CRYS ER 20 MEQ PO TBCR
40.0000 meq | EXTENDED_RELEASE_TABLET | Freq: Once | ORAL | Status: AC
  Administered 2020-01-31: 40 meq via ORAL
  Filled 2020-01-31: qty 2

## 2020-01-31 MED ORDER — POTASSIUM CHLORIDE 10 MEQ/100ML IV SOLN: 10.0000 meq | INTRAVENOUS | Status: DC

## 2020-01-31 NOTE — Progress Notes (Signed)
[facility]  ROUNDING NOTE   Subjective:   Husband at bedside.  ENT evaluated patient and found no more laryngeal ulcer.   Patient continues to have vomiting post prandial.   Objective:  Vital signs in last 24 hours:  Temp:  [98.1 F (36.7 C)-98.3 F (36.8 C)] 98.3 F (36.8 C) (05/16 1230) Pulse Rate:  [65-70] 70 (05/16 1230) Resp:  [15-16] 15 (05/16 1230) BP: (156-168)/(74-78) 156/76 (05/16 1230) SpO2:  [97 %-98 %] 97 % (05/16 1230)  Weight change:  Filed Weights   01/26/20 1138 01/26/20 1957 01/28/20 0936  Weight: 65.8 kg 65.7 kg 65.7 kg    Intake/Output: I/O last 3 completed shifts: In: 2605.1 [I.V.:2155; IV Piggyback:450] Out: 2297 [Urine:4851]   Intake/Output this shift:  Total I/O In: -  Out: 600 [Urine:600]  Physical Exam: General: No acute distress  Head: Normocephalic, atraumatic. Moist oral mucosal membranes  Eyes: Anicteric  Neck: Supple, trachea midline  Lungs:  Clear to auscultation, normal effort  Heart: regular  Abdomen:  Soft, nontender, bowel sounds present  Extremities: No peripheral edema.  Neurologic: Awake, alert, following commands  Skin: No lesions  Access: Right femoral dialysis catheter    Basic Metabolic Panel: Recent Labs  Lab 01/27/20 0543 01/27/20 0543 01/28/20 0505 01/28/20 0505 01/28/20 1020 01/29/20 0539 01/30/20 0553 01/31/20 0549  NA 136  --  142  --   --  144 144 143  K 3.5  --  3.3*  --   --  2.8* 2.5* 2.4*  CL 108  --  113*  --   --  114* 111 108  CO2 17*  --  19*  --   --  24 23 25   GLUCOSE 78  --  85  --   --  80 89 82  BUN 69*  --  65*  --   --  39* 33* 31*  CREATININE 8.04*  --  7.55*  --   --  5.32* 4.56* 3.77*  CALCIUM 8.3*   < > 8.3*   < >  --  8.3* 8.1* 8.7*  MG  --   --   --   --   --   --  1.5*  --   PHOS  --   --  5.3*  --  3.8 4.2 3.9 3.8   < > = values in this interval not displayed.    Liver Function Tests: Recent Labs  Lab 01/26/20 1203 01/28/20 0505 01/29/20 0539  01/30/20 0553 01/31/20 0549  AST 15  --   --   --   --   ALT 11  --   --   --   --   ALKPHOS 55  --   --   --   --   BILITOT 1.0  --   --   --   --   PROT 7.8  --   --   --   --   ALBUMIN 3.4* 2.9* 2.8* 2.7* 3.0*   No results for input(s): LIPASE, AMYLASE in the last 168 hours. No results for input(s): AMMONIA in the last 168 hours.  CBC: Recent Labs  Lab 01/26/20 1203 01/27/20 0543 01/28/20 0505 01/29/20 0539 01/30/20 0553  WBC 11.4* 8.7 7.1 6.0 6.0  HGB 10.3* 8.6* 8.9* 8.5* 8.4*  HCT 29.4* 25.9* 25.8* 24.9* 23.7*  MCV 85.7 89.0 87.2 88.3 84.9  PLT 321 253 245 231 142*    Cardiac Enzymes: No results for input(s): CKTOTAL, CKMB, CKMBINDEX, TROPONINI in  the last 168 hours.  BNP: Invalid input(s): POCBNP  CBG: Recent Labs  Lab 01/27/20 0744 01/28/20 0743 01/29/20 0736 01/30/20 0834 01/31/20 0801  GLUCAP 73 78 80 76 69    Microbiology: Results for orders placed or performed during the hospital encounter of 01/26/20  SARS Coronavirus 2 by RT PCR (hospital order, performed in [facility] hospital lab) Nasopharyngeal Nasopharyngeal Swab     Status: None   Collection Time: 01/26/20  3:54 PM   Specimen: Nasopharyngeal Swab  Result Value Ref Range Status   SARS Coronavirus 2 NEGATIVE NEGATIVE Final    Comment: (NOTE) SARS-CoV-2 target nucleic acids are NOT DETECTED. The SARS-CoV-2 RNA is generally detectable in upper and lower respiratory specimens during the acute phase of infection. The lowest concentration of SARS-CoV-2 viral copies this assay can detect is 250 copies / mL. A negative result does not preclude SARS-CoV-2 infection and should not be used as the sole basis for treatment or other patient management decisions.  A negative result may occur with improper specimen collection / handling, submission of specimen other than nasopharyngeal swab, presence of viral mutation(s) within the areas targeted by this assay, and inadequate number of viral  copies (<250 copies / mL). A negative result must be combined with clinical observations, patient history, and epidemiological information. Fact Sheet for Patients:   StrictlyIdeas.no Fact Sheet for Healthcare Providers: BankingDealers.co.za This test is not yet approved or cleared  by the [place] FDA and has been authorized for detection and/or diagnosis of SARS-CoV-2 by FDA under an Emergency Use Authorization (EUA).  This EUA will remain in effect (meaning this test can be used) for the duration of the COVID-19 declaration under Section 564(b)(1) of the Act, 21 U.S.C. section 360bbb-3(b)(1), unless the authorization is terminated or revoked sooner. Performed at [facility], [address] [place], [place] [address]     Coagulation Studies: No results for input(s): LABPROT, INR in the last 72 hours.  Urinalysis: No results for input(s): COLORURINE, LABSPEC, PHURINE, GLUCOSEU, HGBUR, BILIRUBINUR, KETONESUR, PROTEINUR, UROBILINOGEN, NITRITE, LEUKOCYTESUR in the last 72 hours.  Invalid input(s): APPERANCEUR    Imaging: No results found.   Medications:   . sodium chloride 10 mL/hr at 01/30/20 1700  . 0.9 % NaCl with KCl 40 mEq / L 100 mL/hr (01/31/20 1105)  . sodium chloride     . amLODipine  5 mg Oral Daily  . vitamin C  1,000 mg Oral Daily  . aspirin EC  81 mg Oral Daily  . B-complex with vitamin C  1 tablet Oral Daily  . calcium-vitamin D  1 tablet Oral Daily  . enoxaparin (LOVENOX) injection  30 mg Subcutaneous Q24H  . feeding supplement (PRO-STAT SUGAR FREE 64)  30 mL Oral TID  . levothyroxine  88 mcg Oral Daily  . montelukast  10 mg Oral Daily  . multivitamin with minerals  1 tablet Oral Daily  . omega-3 acid ethyl esters  1 g Oral BID  . pantoprazole  40 mg Oral Daily  . vitamin E  400 Units Oral Daily   sodium chloride, acetaminophen, hydrALAZINE, ondansetron (ZOFRAN) IV, oxymetazoline,  promethazine **OR** promethazine **OR** promethazine, sodium chloride  Assessment/ Plan:  74 y.o.white female with seasonal allergies, left breast cancer, depression, hyperlipidemia, hypertension, hypothyroidism, rheumatic fever, hypothyroidism, recent laryngeal ulcer, who was admitted to [facility] on 01/26/2020 for AKI (acute kidney injury) ([place]) [N17.9] Acute renal failure (ARF) ([place]) [N17.9]   1.  Acute kidney injury: required hemodialysis treatment. Baseline creatinine  of 0.8 on 07/2019 2.  Microscopic hematuria. 3.  Proteinuria. 4.  Metabolic acidosis. 5.  Anemia with renal failure. 6. Hypokalemia  Plan:   Secondary to prerenal azotemia from poor PO intake and vomiting   Nonoliguric urine output. Creatinine and renal function improving. No indication for dialysis at this time.  Serologic work up negative so far.  Hypokalemia secondary to post ATN diuresis - Continue IV fluids - Replace potassium and magnesium.    LOS: 4 Sarath Kolluru 5/16/20211:38 PM

## 2020-01-31 NOTE — Progress Notes (Signed)
PROGRESS NOTE    Michelle Lambert  Lambert:[id number] DOB: 10-28-1945 DOA: 01/26/2020 PCP: [place] Pink, MD      Brief Narrative:  Michelle Lambert is a 74 y.o. F with HTN, hypothyroidism and remote BrCa who presented with several weeks progressive generalized weakness, nausea, as well as acute renal failure.  Patient has started to develop generalized weakness, anorexia, and malaise several weeks ago, roughly around the time she was diagnosed with a laryngeal ulcer.  Then recently she was seen by her PCP who ordered a BMP which was abnormal and she was sent to the ER due to her acute kidney injury.  In the ER, creatinine 8, up from 5 two days prior (BUN 67, baseline creatinine last November 0.8).  Renal US unremarkable.  CXR clear.  UA with heavy leukocytes.          Assessment & Plan:  Acute renal failure Output yesterday without IV fluids.  Creatinine down to 3.77 today.   ANCA negative, anti-GBM negative, complements normal ANA negative This appears to be resolving -Trend electrolytes    Intractable nausea and vomiting Patient has had several months of nausea.  She has tried antiemetics, antacids, without relief.  As a result of this chronic nausea, she has lost weight prior to admission.  During this admission, her nausea improves with antiemetics, but any attempt at solid food and she vomits suddenly.  Sometimes this is preceded by coughing, then gagging or dry heaves. -SLP consult -GI consult, appreciate advice     Hypertension Hyperlipidemia Blood pressure slightly elevated -Continue amlodipine -Hold candesartan, HCTZ -Continue Crestor  Hypothyroidism -Continue levothyroxine  GERD/reflux -Continue pantoprazole  Moderate protein calorie malnutrition -Consult nutrition  Hypokalemia Hypomagnesemia Potassium is lower today -Continue supplement potassium cautiously   Anemia of renal failure hgb stable, no clinical  bleeding        Disposition: Status is: Inpatient  Remains inpatient appropriate because: Her renal failure is resolving, but she is still unable to take any solid food by mouth   Dispo: The patient is from: Home              Anticipated d/c is to: TBD              Anticipated d/c date is: 1 to 2 days              Patient currently is not medically stable to d/c.  The patient's renal function is improving and she is making good urine output.  However she still has inability to take anything solid by mouth.  Speech therapy and GI.                MDM: The below labs and imaging reports reviewed and summarized above.  Medication management as above.       DVT prophylaxis: Heparin Code Status: FULL Family Communication: Husband at the bedside    Consultants:   Nephrology  IR  Procedures:   5/12 temporary HD catheter insertion   5/13 first dialysis session  5/15 dialysis catheter removal  Antimicrobials:      Culture data:              Subjective: Patient still unable to take anything solid by mouth without vomiting.  No fever, confusion.  She is making good urine output.  No respiratory distress or swelling.  Sinus congestion is resolved.        Objective: Vitals:   01/30/20 1217 01/30/20 2101 01/31/20 0442 01/31/20 1230  BP: Marland Kitchen)  163/72 (!) 168/74 (!) 167/78 (!) 156/76  Pulse: 75 65 66 70  Resp: _0 Temp: 98.1 F (36.7 C) 98.2 F (36.8 C) 98.1 F (36.7 C) 98.3 F (36.8 C)  TempSrc: Oral Oral Oral Oral  SpO2: 98% 98% 98% 97%  Weight:      Height:        Intake/Output Summary (Last 24 hours) at 01/31/2020 1246 Last data filed at 01/31/2020 0800 Gross per 24 hour  Intake 834.35 ml  Output 2551 ml  Net -1716.65 ml   Filed Weights   01/26/20 1138 01/26/20 1957 01/28/20 0936  Weight: 65.8 kg 65.7 kg 65.7 kg    Examination: General appearance: Adult female, lying in bed, no acute distress, interactive      HEENT: Anicteric, conjunctival pink, lids and lashes normal.  No nasal deformity, discharge, or epistaxis.  Lips normal, oropharynx moist, dentures in place, no oral lesions, hearing normal. Skin: No suspicious rashes or lesions on the face, neck, arms, abdomen, and legs. Cardiac: RRR, no murmurs, no lower extremity edema Respiratory: Respiratory rate is normal, lungs clear without rales or wheezes Abdomen: No tenderness in all quadrants.  She has voluntary guarding throughout.  No hepatosplenomegaly, no distention. MSK: Normal muscle bulk and tone. Neuro: Extraocular movements intact, moves all extremities with normal strength and coronation, speech fluent. Psych: Attention normal, affect normal, judgment insight appeared normal          Data Reviewed: I have personally reviewed following labs and imaging studies:  CBC: Recent Labs  Lab 01/26/20 1203 01/27/20 0543 01/28/20 0505 01/29/20 0539 01/30/20 0553  WBC 11.4* 8.7 7.1 6.0 6.0  HGB 10.3* 8.6* 8.9* 8.5* 8.4*  HCT 29.4* 25.9* 25.8* 24.9* 23.7*  MCV 85.7 89.0 87.2 88.3 84.9  PLT 321 253 245 231 979*   Basic Metabolic Panel: Recent Labs  Lab 01/27/20 0543 01/28/20 0505 01/28/20 1020 01/29/20 0539 01/30/20 0553 01/31/20 0549  NA 136 142  --  144 144 143  K 3.5 3.3*  --  2.8* 2.5* 2.4*  CL 108 113*  --  114* 111 108  CO2 17* 19*  --  _1 GLUCOSE 78 85  --  80 89 82  BUN 69* 65*  --  39* 33* 31*  CREATININE 8.04* 7.55*  --  5.32* 4.56* 3.77*  CALCIUM 8.3* 8.3*  --  8.3* 8.1* 8.7*  MG  --   --   --   --  1.5*  --   PHOS  --  5.3* 3.8 4.2 3.9 3.8   GFR: Estimated Creatinine Clearance: 12 mL/min (A) (by C-G formula based on SCr of 3.77 mg/dL (H)). Liver Function Tests: Recent Labs  Lab 01/26/20 1203 01/28/20 0505 01/29/20 0539 01/30/20 0553 01/31/20 0549  AST 15  --   --   --   --   ALT 11  --   --   --   --   ALKPHOS 55  --   --   --   --   BILITOT 1.0  --   --   --   --   PROT 7.8  --   --   --    --   ALBUMIN 3.4* 2.9* 2.8* 2.7* 3.0*   No results for input(s): LIPASE, AMYLASE in the last 168 hours. No results for input(s): AMMONIA in the last 168 hours. Coagulation Profile: No results for input(s): INR, PROTIME in the last 168 hours. Cardiac Enzymes: No results  for input(s): CKTOTAL, CKMB, CKMBINDEX, TROPONINI in the last 168 hours. BNP (last 3 results) No results for input(s): PROBNP in the last 8760 hours. HbA1C: No results for input(s): HGBA1C in the last 72 hours. CBG: Recent Labs  Lab 01/27/20 0744 01/28/20 0743 01/29/20 0736 01/30/20 0834 01/31/20 0801  GLUCAP 73 78 80 76 70   Lipid Profile: No results for input(s): CHOL, HDL, LDLCALC, TRIG, CHOLHDL, LDLDIRECT in the last 72 hours. Thyroid Function Tests: No results for input(s): TSH, T4TOTAL, FREET4, T3FREE, THYROIDAB in the last 72 hours. Anemia Panel: No results for input(s): VITAMINB12, FOLATE, FERRITIN, TIBC, IRON, RETICCTPCT in the last 72 hours. Urine analysis:    Component Value Date/Time   COLORURINE YELLOW (A) 01/26/2020 1203   APPEARANCEUR CLOUDY (A) 01/26/2020 1203   LABSPEC 1.008 01/26/2020 1203   PHURINE 7.0 01/26/2020 1203   GLUCOSEU NEGATIVE 01/26/2020 1203   HGBUR SMALL (A) 01/26/2020 1203   BILIRUBINUR NEGATIVE 01/26/2020 1203   KETONESUR NEGATIVE 01/26/2020 1203   PROTEINUR 100 (A) 01/26/2020 1203   NITRITE NEGATIVE 01/26/2020 1203   LEUKOCYTESUR LARGE (A) 01/26/2020 1203   Sepsis Labs: _0 (procalcitonin:4,lacticacidven:4)  ) Recent Results (from the past 240 hour(s))  SARS Coronavirus 2 by RT PCR (hospital order, performed in [place] hospital lab) Nasopharyngeal Nasopharyngeal Swab     Status: None   Collection Time: 01/26/20  3:54 PM   Specimen: Nasopharyngeal Swab  Result Value Ref Range Status   SARS Coronavirus 2 NEGATIVE NEGATIVE Final    Comment: (NOTE) SARS-CoV-2 target nucleic acids are NOT DETECTED. The SARS-CoV-2 RNA is generally detectable in upper and  lower respiratory specimens during the acute phase of infection. The lowest concentration of SARS-CoV-2 viral copies this assay can detect is 250 copies / mL. A negative result does not preclude SARS-CoV-2 infection and should not be used as the sole basis for treatment or other patient management decisions.  A negative result may occur with improper specimen collection / handling, submission of specimen other than nasopharyngeal swab, presence of viral mutation(s) within the areas targeted by this assay, and inadequate number of viral copies (<250 copies / mL). A negative result must be combined with clinical observations, patient history, and epidemiological information. Fact Sheet for Patients:   StrictlyIdeas.no Fact Sheet for Healthcare Providers: BankingDealers.co.za This test is not yet approved or cleared  by the [place] FDA and has been authorized for detection and/or diagnosis of SARS-CoV-2 by FDA under an Emergency Use Authorization (EUA).  This EUA will remain in effect (meaning this test can be used) for the duration of the COVID-19 declaration under Section 564(b)(1) of the Act, 21 U.S.C. section 360bbb-3(b)(1), unless the authorization is terminated or revoked sooner. Performed at [facility], [address] [place], [place] [address]          Radiology Studies: No results found.      Scheduled Meds: . amLODipine  5 mg Oral Daily  . vitamin C  1,000 mg Oral Daily  . aspirin EC  81 mg Oral Daily  . B-complex with vitamin C  1 tablet Oral Daily  . calcium-vitamin D  1 tablet Oral Daily  . feeding supplement (PRO-STAT SUGAR FREE 64)  30 mL Oral TID  . heparin  5,000 Units Subcutaneous Q8H  . levothyroxine  88 mcg Oral Daily  . montelukast  10 mg Oral Daily  . multivitamin with minerals  1 tablet Oral Daily  . omega-3 acid ethyl esters  1 g Oral BID  .  pantoprazole  40 mg Oral Daily  .  vitamin E  400 Units Oral Daily   Continuous Infusions: . sodium chloride 10 mL/hr at 01/30/20 1700  . 0.9 % NaCl with KCl 40 mEq / L 100 mL/hr (01/31/20 1105)  . sodium chloride       LOS: 4 days    Time spent: 25 minutes    Edwin Dada, MD [facility] 01/31/2020, 12:46 PM     Please page though [place] or Epic secure chat:  For [employer], Adult nurse

## 2020-01-31 NOTE — Plan of Care (Signed)
Continuing with plan of care. 

## 2020-01-31 NOTE — Consult Note (Signed)
Michelle Lambert, B[place] [id number] 1945/11/17 Michelle Nearing, MD  Reason for Consult: Issues swallowing Requesting Physician: Edwin Dada, * Consulting Physician: Michelle Lambert  HPI: This 74 y.o. year old female was admitted on 01/26/2020 for AKI (acute kidney injury) ([place]) [N17.9] Acute renal failure (ARF) ([place]) [N17.9]. I had seen her as an outpatient for severe throat pain previously and diagnosed a right laryngeal arytenoid ulcer. This was treated with reflux meds, prednisone, and clarithromycin, and at last visit 5 weeks ago the ulcer was resolving with just some residual mild redness. She says she feels her throat has continued to improve, denying sore throat. A significant ongoing issue has been poor appetite with chronic nausea and decreased taste, though the sense of taste has been improving. She says she can eat solid foods and they do go down without much difficulty, though at times she will start to cough and gag until she dry heaves or vomits.  Allergies: No Known Allergies  Medications:  Facility-Administered Medications Prior to Admission  Medication Dose Route Frequency Provider Last Rate Last Admin  . 0.9 %  sodium chloride infusion  500 mL Intravenous Continuous Lafayette Dragon, MD       Medications Prior to Admission  Medication Sig Dispense Refill  . Ascorbic Acid (VITAMIN C) 1000 MG tablet Take 1,000 mg by mouth daily.      Marland Kitchen aspirin 81 MG EC tablet Take 81 mg by mouth daily.      Marland Kitchen b complex vitamins tablet Take 1 tablet by mouth daily.    . calcium-vitamin D (OSCAL-500) 500-400 MG-UNIT per tablet Take 1 tablet by mouth daily.      . candesartan-hydrochlorothiazide (ATACAND HCT) 16-12.5 MG tablet Take 1 tablet by mouth daily.    Marland Kitchen doxycycline (VIBRAMYCIN) 100 MG capsule Take 100 mg by mouth 2 (two) times daily.    Marland Kitchen levothyroxine (SYNTHROID) 88 MCG tablet Take 88 mcg by mouth daily.     . montelukast (SINGULAIR) 10 MG tablet Take 10 mg by mouth daily.    . Multiple  Vitamin (MULTI-VITAMIN) tablet Take by mouth.    . omega-3 acid ethyl esters (LOVAZA) 1 g capsule Take 1 g by mouth 2 (two) times daily.    Marland Kitchen omeprazole (PRILOSEC) 40 MG capsule Take 40 mg by mouth daily.    . rosuvastatin (CRESTOR) 20 MG tablet Take 20 mg by mouth daily.      . vitamin E 400 UNIT capsule Take 400 Units by mouth daily.      .  Current Facility-Administered Medications  Medication Dose Route Frequency Provider Last Rate Last Admin  . 0.9 %  sodium chloride infusion   Intravenous PRN Edwin Dada, MD 10 mL/hr at 01/30/20 1700 Rate Verify at 01/30/20 1700  . 0.9 % NaCl with KCl 40 mEq / L  infusion   Intravenous Continuous Edwin Dada, MD 100 mL/hr at 01/31/20 1105 100 mL/hr at 01/31/20 1105  . acetaminophen (TYLENOL) tablet 650 mg  650 mg Oral Q6H PRN Ivor Costa, MD   650 mg at 01/30/20 1729  . amLODipine (NORVASC) tablet 5 mg  5 mg Oral Daily Ivor Costa, MD   5 mg at 01/31/20 0836  . ascorbic acid (VITAMIN C) tablet 1,000 mg  1,000 mg Oral Daily Ivor Costa, MD   1,000 mg at 01/31/20 0834  . aspirin EC tablet 81 mg  81 mg Oral Daily Gerald Dexter, RPH   81 mg at 01/31/20 0836  . B-complex  with vitamin C tablet 1 tablet  1 tablet Oral Daily Ivor Costa, MD   1 tablet at 01/31/20 [phone]  . calcium-vitamin D (OSCAL WITH D) 500-200 MG-UNIT per tablet 1 tablet  1 tablet Oral Daily Ivor Costa, MD   1 tablet at 01/31/20 [phone]  . feeding supplement (PRO-STAT SUGAR FREE 64) liquid 30 mL  30 mL Oral TID Edwin Dada, MD   30 mL at 01/30/20 0922  . heparin injection 5,000 Units  5,000 Units Subcutaneous Q8H Ivor Costa, MD   5,000 Units at 01/31/20 0553  . hydrALAZINE (APRESOLINE) injection 5 mg  5 mg Intravenous Q2H PRN Ivor Costa, MD      . levothyroxine (SYNTHROID) tablet 88 mcg  88 mcg Oral Daily Ivor Costa, MD   88 mcg at 01/31/20 0552  . montelukast (SINGULAIR) tablet 10 mg  10 mg Oral Daily Ivor Costa, MD   10 mg at 01/31/20 9326  . multivitamin with  minerals tablet 1 tablet  1 tablet Oral Daily Ivor Costa, MD   1 tablet at 01/31/20 [phone]  . omega-3 acid ethyl esters (LOVAZA) capsule 1 g  1 g Oral BID Ivor Costa, MD   1 g at 01/31/20 0839  . ondansetron (ZOFRAN) injection 4 mg  4 mg Intravenous Q8H PRN Ivor Costa, MD   4 mg at 01/28/20 0559  . oxymetazoline (AFRIN) 0.05 % nasal spray 1 spray  1 spray Each Nare QID PRN Edwin Dada, MD   1 spray at 01/30/20 0304  . pantoprazole (PROTONIX) EC tablet 40 mg  40 mg Oral Daily Ivor Costa, MD   40 mg at 01/31/20 5809  . promethazine (PHENERGAN) tablet 25 mg  25 mg Oral Q6H PRN Edwin Dada, MD   25 mg at 01/31/20 9833   Or  . promethazine (PHENERGAN) injection 12.5 mg  12.5 mg Intravenous Q6H PRN Edwin Dada, MD   12.5 mg at 01/30/20 2111   Or  . promethazine (PHENERGAN) suppository 25 mg  25 mg Rectal Q6H PRN Danford, Suann Larry, MD      . sodium chloride (OCEAN) 0.65 % nasal spray 1 spray  1 spray Each Nare PRN Danford, Christopher P, MD      . sodium chloride 0.9 % bolus 500 mL  500 mL Intravenous Once Danford, Suann Larry, MD      . vitamin E capsule 400 Units  400 Units Oral Daily Ivor Costa, MD   400 Units at 01/31/20 8250    PMH:  Past Medical History:  Diagnosis Date  . Allergy    seasonal  . Cancer ([place])    left/chemo 1989  . Depression   . Hyperlipidemia   . Hypertension   . Hypothyroidism   . Rheumatic fever   . Thyroid disease     Fam Hx:  Family History  Problem Relation Age of Onset  . Heart disease Mother   . Emphysema Father     Soc Hx:  Social History   Socioeconomic History  . Marital status: Married    Spouse name: Not on file  . Number of children: Not on file  . Years of education: Not on file  . Highest education level: Not on file  Occupational History  . Not on file  Tobacco Use  . Smoking status: Former Research scientist (life sciences)  . Smokeless tobacco: Never Used  Substance and Sexual Activity  . Alcohol use: No  . Drug use: No   . Sexual activity: Not on  file  Other Topics Concern  . Not on file  Social History Narrative  . Not on file   Social Determinants of Health   Financial Resource Strain:   . Difficulty of Paying Living Expenses:   Food Insecurity:   . Worried About Charity fundraiser in the Last Year:   . Arboriculturist in the Last Year:   Transportation Needs:   . Film/video editor (Medical):   Marland Kitchen Lack of Transportation (Non-Medical):   Physical Activity:   . Days of Exercise per Week:   . Minutes of Exercise per Session:   Stress:   . Feeling of Stress :   Social Connections:   . Frequency of Communication with Friends and Family:   . Frequency of Social Gatherings with Friends and Family:   . Attends Religious Services:   . Active Member of Clubs or Organizations:   . Attends Archivist Meetings:   Marland Kitchen Marital Status:   Intimate Partner Violence:   . Fear of Current or Ex-Partner:   . Emotionally Abused:   Marland Kitchen Physically Abused:   . Sexually Abused:     PSH:  Past Surgical History:  Procedure Laterality Date  . BREAST BIOPSY Right 06/13/2006   benign  . BREAST EXCISIONAL BIOPSY Right 1982   benign  . BREAST IMPLANT REMOVAL     left with replacement implant 2011  . BREAST RECONSTRUCTION     [phone] left  . COLONOSCOPY    . COLONOSCOPY WITH PROPOFOL N/A 06/06/2018   Procedure: COLONOSCOPY WITH PROPOFOL;  Surgeon: Lollie Sails, MD;  Location: [facility] ENDOSCOPY;  Service: Endoscopy;  Laterality: N/A;  . DIALYSIS/PERMA CATHETER INSERTION N/A 01/27/2020   Procedure: DIALYSIS/PERMA CATHETER INSERTION;  Surgeon: Katha Cabal, MD;  Location: [facility];  Service: Cardiovascular;  Laterality: N/A;  . MASTECTOMY Left    left 1989  . POLYPECTOMY    . Procedures since admission: No admission procedures for hospital encounter.  ROS: Review of systems normal other than 12 systems except per HPI.  PHYSICAL EXAM Vitals:  Vitals:   01/30/20 2101  01/31/20 0442  BP: (!) 168/74 (!) 167/78  Pulse: 65 66  Resp: 16 16  Temp: 98.2 F (36.8 C) 98.1 F (36.7 C)  SpO2: 98% 98%  . General: Well-developed, Well-nourished in no acute distress Mood: Mood and affect well adjusted, pleasant and cooperative. Orientation: Grossly alert and oriented. Vocal Quality: No hoarseness. Communicates verbally. head and Face: NCAT. No facial asymmetry. No visible skin lesions. No significant facial scars. No tenderness with sinus percussion. Facial strength normal and symmetric. Ears: External ears with normal landmarks, no lesions. External auditory canals free of infection, cerumen impaction or lesions. Tympanic membranes intact with good landmarks and normal mobility on pneumatic otoscopy. No middle ear effusion. Hearing: Speech reception grossly normal. Nose: External nose normal with midline dorsum and no lesions or deformity. Nasal Cavity reveals essentially midline septum with normal inferior turbinates. No significant mucosal congestion or erythema. Nasal secretions are minimal and clear. No polyps seen on anterior rhinoscopy. Oral Cavity/ Oropharynx: Lips are normal with no lesions. Teeth no frank dental caries. Gingiva healthy with no lesions or gingivitis. Oropharynx including tongue, buccal mucosa, floor of mouth, hard and soft palate, uvula and posterior pharynx free of exudates, erythema or lesions with normal symmetry and hydration.  Indirect Laryngoscopy/Nasopharyngoscopy: Visualization of the larynx, hypopharynx and nasopharynx is not possible in this setting with routine examination. Neck: Supple and symmetric with  no palpable masses, tenderness or crepitance. The trachea is midline. Thyroid gland is soft, nontender and symmetric with no masses or enlargement. Parotid and submandibular glands are soft, nontender and symmetric, without masses. Lymphatic: Cervical lymph nodes are without palpable lymphadenopathy or tenderness. Respiratory: Normal  respiratory effort without labored breathing. Cardiovascular: Carotid pulse shows regular rate and rhythm Neurologic: Cranial Nerves II through XII are grossly intact. Eyes: Gaze and Ocular Motility are grossly normal. PERRLA. No visible nystagmus.  MEDICAL DECISION MAKING: Data Review:  Results for orders placed or performed during the hospital encounter of 01/26/20 (from the past 48 hour(s))  CBC     Status: Abnormal   Collection Time: 01/30/20  5:53 AM  Result Value Ref Range   WBC 6.0 4.0 - 10.5 K/uL   RBC 2.79 (L) 3.87 - 5.11 MIL/uL   Hemoglobin 8.4 (L) 12.0 - 15.0 g/dL   HCT 23.7 (L) 36.0 - 46.0 %   MCV 84.9 80.0 - 100.0 fL   MCH 30.1 26.0 - 34.0 pg   MCHC 35.4 30.0 - 36.0 g/dL   RDW 12.4 11.5 - 15.5 %   Platelets 142 (L) 150 - 400 K/uL   nRBC 0.0 0.0 - 0.2 %    Comment: Performed at [facility], [address] [place], [place] [address]  Renal function panel     Status: Abnormal   Collection Time: 01/30/20  5:53 AM  Result Value Ref Range   Sodium 144 135 - 145 mmol/L   Potassium 2.5 (LL) 3.5 - 5.1 mmol/L    Comment: CRITICAL RESULT CALLED TO, READ BACK BY AND VERIFIED WITH TAMARA CONYERS 01/30/20 0640 SJL    Chloride 111 98 - 111 mmol/L   CO2 23 22 - 32 mmol/L   Glucose, Bld 89 70 - 99 mg/dL    Comment: Glucose reference range applies only to samples taken after fasting for at least 8 hours.   BUN 33 (H) 8 - 23 mg/dL   Creatinine, Ser 4.56 (H) 0.44 - 1.00 mg/dL   Calcium 8.1 (L) 8.9 - 10.3 mg/dL   Phosphorus 3.9 2.5 - 4.6 mg/dL   Albumin 2.7 (L) 3.5 - 5.0 g/dL   GFR calc non Af Amer 9 (L) >60 mL/min   GFR calc Af Amer 10 (L) >60 mL/min   Anion gap 10 5 - 15    Comment: Performed at [facility], [address], [place], [place] [address]  Magnesium     Status: Abnormal   Collection Time: 01/30/20  5:53 AM  Result Value Ref Range   Magnesium 1.5 (L) 1.7 - 2.4 mg/dL    Comment: Performed at [facility], [address]  [place], [place] [address]  Glucose, capillary     Status: None   Collection Time: 01/30/20  8:34 AM  Result Value Ref Range   Glucose-Capillary 76 70 - 99 mg/dL    Comment: Glucose reference range applies only to samples taken after fasting for at least 8 hours.   Comment 1 Notify RN   Renal function panel     Status: Abnormal   Collection Time: 01/31/20  5:49 AM  Result Value Ref Range   Sodium 143 135 - 145 mmol/L   Potassium 2.4 (LL) 3.5 - 5.1 mmol/L    Comment: CRITICAL RESULT CALLED TO, READ BACK BY AND VERIFIED WITH KIM OLOVER ON 01/31/20 AT 0647 BY JAG    Chloride 108 98 - 111 mmol/L   CO2 25 22 - 32  mmol/L   Glucose, Bld 82 70 - 99 mg/dL    Comment: Glucose reference range applies only to samples taken after fasting for at least 8 hours.   BUN 31 (H) 8 - 23 mg/dL   Creatinine, Ser 3.77 (H) 0.44 - 1.00 mg/dL   Calcium 8.7 (L) 8.9 - 10.3 mg/dL   Phosphorus 3.8 2.5 - 4.6 mg/dL   Albumin 3.0 (L) 3.5 - 5.0 g/dL   GFR calc non Af Amer 11 (L) >60 mL/min   GFR calc Af Amer 13 (L) >60 mL/min   Anion gap 10 5 - 15    Comment: Performed at [facility], [address] [place],  [address]  Glucose, capillary     Status: None   Collection Time: 01/31/20  8:01 AM  Result Value Ref Range   Glucose-Capillary 70 70 - 99 mg/dL    Comment: Glucose reference range applies only to samples taken after fasting for at least 8 hours.  . No results found.Marland Kitchen   PROCEDURE: Procedure: Diagnostic Fiberoptic Nasolaryngoscopy Diagnosis: Dysphagia, vomiting, prior laryngeal ulcer Indications: Evaluate throat Findings:Nasal cavity and nasopharynx clear. TVC clear and mobile. Pachyderma of the posterior glottis typical of chronic reflux. Previous laryngeal ulcer healed with no residual erythema. Hypopharynx and tongue base are clear. Description of Procedure: After discussing procedure and risks  (primarily nose bleed) with the patient, the nose was anesthetized with topical Lidocaine 4% and  decongested with phenylephrine. A flexible fiberoptic scope was passed through the nasal cavity. The nasal cavity was inspected and the scope passed through the Nasopharynx to the region of the hypopharynx and larynx. The patient was instructed to phonate to assess vocal cord mobility. The tongue was extended to evaluate the tongue base completely. Valsalva was performed to insufflate the hypopharynx for improved examination. Findings are as noted above. The scope was withdrawn. The patient tolerated the procedure well.  ASSESSMENT: Exam of the hypopharynx and larynx has continued to improve since patient last seen at my office. Would continue reflux management. Reasonable to get swallowing assessment with SLP, but also may want to consider input from GI given the ongoing chronic nausea. Some of this may be related to general deconditioning and the kidney failure.  PLAN: As above.    Michelle Nearing, MD 01/31/2020 11:24 AM

## 2020-02-01 DIAGNOSIS — R112 Nausea with vomiting, unspecified: Secondary | ICD-10-CM

## 2020-02-01 DIAGNOSIS — R1110 Vomiting, unspecified: Secondary | ICD-10-CM

## 2020-02-01 LAB — RENAL FUNCTION PANEL
Albumin: 3.1 g/dL — ABNORMAL LOW (ref 3.5–5.0)
Anion gap: 9 (ref 5–15)
BUN: 25 mg/dL — ABNORMAL HIGH (ref 8–23)
CO2: 25 mmol/L (ref 22–32)
Calcium: 8.8 mg/dL — ABNORMAL LOW (ref 8.9–10.3)
Chloride: 108 mmol/L (ref 98–111)
Creatinine, Ser: 3.08 mg/dL — ABNORMAL HIGH (ref 0.44–1.00)
GFR calc Af Amer: 17 mL/min — ABNORMAL LOW (ref >60)
GFR calc non Af Amer: 14 mL/min — ABNORMAL LOW (ref >60)
Glucose, Bld: 84 mg/dL (ref 70–99)
Phosphorus: 3.1 mg/dL (ref 2.5–4.6)
Potassium: 2.7 mmol/L — CL (ref 3.5–5.1)
Sodium: 142 mmol/L (ref 135–145)

## 2020-02-01 LAB — PROTEIN ELECTROPHORESIS, SERUM
A/G Ratio: 0.9 (ref 0.7–1.7)
Albumin ELP: 2.8 g/dL — ABNORMAL LOW (ref 2.9–4.4)
Alpha-1-Globulin: 0.3 g/dL (ref 0.0–0.4)
Alpha-2-Globulin: 0.8 g/dL (ref 0.4–1.0)
Beta Globulin: 0.7 g/dL (ref 0.7–1.3)
Gamma Globulin: 1.3 g/dL (ref 0.4–1.8)
Globulin, Total: 3.2 g/dL (ref 2.2–3.9)
Total Protein ELP: 6 g/dL (ref 6.0–8.5)

## 2020-02-01 LAB — MAGNESIUM: Magnesium: 2.2 mg/dL (ref 1.7–2.4)

## 2020-02-01 LAB — CBC
HCT: 26.4 % — ABNORMAL LOW (ref 36.0–46.0)
Hemoglobin: 9.4 g/dL — ABNORMAL LOW (ref 12.0–15.0)
MCH: 29.7 pg (ref 26.0–34.0)
MCHC: 35.6 g/dL (ref 30.0–36.0)
MCV: 83.5 fL (ref 80.0–100.0)
Platelets: 245 10*3/uL (ref 150–400)
RBC: 3.16 MIL/uL — ABNORMAL LOW (ref 3.87–5.11)
RDW: 12.4 % (ref 11.5–15.5)
WBC: 7.3 10*3/uL (ref 4.0–10.5)
nRBC: 0 % (ref 0.0–0.2)

## 2020-02-01 LAB — GLUCOSE, CAPILLARY: Glucose-Capillary: 76 mg/dL (ref 70–99)

## 2020-02-01 MED ORDER — ENSURE MAX PROTEIN PO LIQD
11.0000 [oz_av] | Freq: Two times a day (BID) | ORAL | Status: DC
  Filled 2020-02-01: qty 330

## 2020-02-01 MED ORDER — POLYETHYLENE GLYCOL 3350 17 G PO PACK
17.0000 g | PACK | Freq: Two times a day (BID) | ORAL | Status: DC | PRN
  Filled 2020-02-01: qty 1

## 2020-02-01 MED ORDER — SENNOSIDES-DOCUSATE SODIUM 8.6-50 MG PO TABS
1.0000 | ORAL_TABLET | Freq: Two times a day (BID) | ORAL | Status: DC | PRN
  Administered 2020-02-01: 1 via ORAL
  Filled 2020-02-01: qty 1

## 2020-02-01 MED ORDER — POTASSIUM CHLORIDE CRYS ER 20 MEQ PO TBCR
30.0000 meq | EXTENDED_RELEASE_TABLET | Freq: Three times a day (TID) | ORAL | Status: AC
  Administered 2020-02-01 (×3): 30 meq via ORAL
  Filled 2020-02-01 (×3): qty 1

## 2020-02-01 NOTE — Progress Notes (Addendum)
Nutrition Follow Up Note   DOCUMENTATION CODES:   Non-severe (moderate) malnutrition in context of social or environmental circumstances  INTERVENTION:   Ensure Max protein supplement BID, each supplement provides 150kcal and 30g of protein.  Magic cup TID with meals, each supplement provides 290 kcal and 9 grams of protein  MVI daily   B complex with C po daily   Pt at high refeed risk; recommend monitor K, Mg and P labs daily as oral intake improves.   NUTRITION DIAGNOSIS:   Moderate Malnutrition related to social / environmental circumstances(decreased appetite, nausea and vomiting) as evidenced by mild fat depletion, mild to moderate muscle depletion, 12 percent weight loss in 3 months.  GOAL:   Patient will meet greater than or equal to 90% of their needs  -progressing   MONITOR:   PO intake, Supplement acceptance, Labs, Weight trends, Skin, I & O's  ASSESSMENT:   74 y.o. female with medical history significant of hypertension, hyperlipidemia, hypothyroidism, depression, breast cancer (s/p of mastectomy and chemotherapy 1989), who presents with abnormal lab and generalized weakness.   Pt with improved appetite and oral intake over the past couple of days; pt eating 30-90% of meals. Pt is refusing prostat supplements; RD will discontinue and add Ensure Max as pt is no longer having nausea and vomiting. Pt is at refeed risk. 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 Pt seen by SLP who recommended regular diet. Renal function improving; UOP 3760ml. No more HD at this time. Pt requesting EGD; this may be done as an outpatient. No new weight since 5/13; will request weekly weights.   Medications reviewed and include: vitamin C, aspirin, B-complex with C, oscal w/ D, lovenox, synthroid, MVI,  Lovaza, KCl,  protonix, vitamin E  Labs reviewed: K 2.7(L), BUN 25(H), creat 3.08(H), P 3.1 wnl, Mg 2.2 wnl Hgb 9.4(L), Hct 26.4(L)  Diet Order:   Diet Order             Diet NPO time specified  Diet effective midnight        Diet regular Room service appropriate? Yes; Fluid consistency: Thin  Diet effective now             EDUCATION NEEDS:   Education needs have been addressed  Skin:  Skin Assessment: Reviewed RN Assessment  Last BM:  5/12  Height:   Ht Readings from Last 1 Encounters:  01/26/20 5\' 2"  (1.575 m)    Weight:   Wt Readings from Last 1 Encounters:  01/28/20 65.7 kg    Ideal Body Weight:  50 kg  BMI:  Body mass index is 26.49 kg/m.  Estimated Nutritional Needs:   Kcal:  1500-1700kcal/day  Protein:  75-85g/day  Fluid:  >1.3L/day  Koleen Distance MS, RD, LDN Please refer to [facility] for RD and/or RD on-call/weekend/after hours pager

## 2020-02-01 NOTE — Evaluation (Signed)
Clinical/Bedside Swallow Evaluation Patient Details  Name: Michelle Lambert MRN: [id number] Date of Birth: 1946-05-31  Today's Date: 02/01/2020 Time: SLP Start Time (ACUTE ONLY): 0830 SLP Stop Time (ACUTE ONLY): 0930 SLP Time Calculation (min) (ACUTE ONLY): 60 min  Past Medical History:  Past Medical History:  Diagnosis Date  . Allergy    seasonal  . Cancer ([place])    left/chemo 1989  . Depression   . Hyperlipidemia   . Hypertension   . Hypothyroidism   . Rheumatic fever   . Thyroid disease    Past Surgical History:  Past Surgical History:  Procedure Laterality Date  . BREAST BIOPSY Right 06/13/2006   benign  . BREAST EXCISIONAL BIOPSY Right 1982   benign  . BREAST IMPLANT REMOVAL     left with replacement implant 2011  . BREAST RECONSTRUCTION     [phone] left  . COLONOSCOPY    . COLONOSCOPY WITH PROPOFOL N/A 06/06/2018   Procedure: COLONOSCOPY WITH PROPOFOL;  Surgeon: Lollie Sails, MD;  Location: [facility] ENDOSCOPY;  Service: Endoscopy;  Laterality: N/A;  . DIALYSIS/PERMA CATHETER INSERTION N/A 01/27/2020   Procedure: DIALYSIS/PERMA CATHETER INSERTION;  Surgeon: Katha Cabal, MD;  Location: [facility];  Service: Cardiovascular;  Laterality: N/A;  . MASTECTOMY Left    left 1989  . POLYPECTOMY     HPI:  Pt is a 74 y.o. female with medical history significant of hypertension, hyperlipidemia, hypothyroidism, depression, breast cancer (s/p of mastectomy and chemotherapy 1989), who presents with abnormal lab and generalized weakness.  Patient states that she has been having generalized weakness, fatigue, decreased appetite and oral intake for several months, which has been worsening progressively.  Patient was seen by PCP yesterday, and had blood work done at her primary doctor's office. She was told that she needed to come to the ER for her elevated WBCs and kidney function. She states that she had laryngeal ulcer, and was treated by ENT in March.  Because of  this issue, she has been having poor appetite and decreased oral intake.  Per ENT note yesterday, severe throat pain previously and diagnosed a right laryngeal arytenoid ulcer. This was treated with reflux meds, prednisone, and clarithromycin, and at last visit 5 weeks ago the ulcer was resolving with just some residual mild redness. She says she feels her throat has continued to improve, denying sore throat. A significant ongoing issue has been poor appetite with chronic nausea and decreased taste, though the sense of taste has been improving. She says she can eat solid foods and they do go down without much difficulty, though at times she will start to cough and gag until she dry heaves or vomits. Per Direct Viewing, "TVC clear and mobile. Pachyderma of the posterior glottis typical of chronic reflux. Previous laryngeal ulcer healed with no residual erythema. Hypopharynx and Nasaopharynx and tongue base are clear".  DG Esophagus(GI) revealed "Intermittent lower esophageal spasm without persisting stricture; mild Esophageal dysmotility".    Assessment / Plan / Recommendation Clinical Impression  Pt appears to present w/ adequate oropharyngeal phase swallowing function w/ No oropharyngeal phase dysphagia appreciated; No Neuromuscular swallowing deficits appreciated. Pt is at reduced risk for aspiration following general aspiration precautions. Pt has a baseline of REFLUX and has been on a PPI recently since being seen by ENT ~2 months ago. S/S of REFLUX were ID then and right laryngeal arytenoid ulcer was treated, PPI initiated. Pt continued to experience feelings of Reflux and mild nausea/gagging but this is improving  per pt report. She is able to eat some solid foods now, continues to drink liquids. ANY Regurgitation of Reflux material can increase risk for aspiration of the Reflux material during the backflow thus impact Pulmonary status. Pt sat upright in bed and consumed trials of thin liquids via Cup and  soft solids w/ no overt clinical s/s of aspiration noted; clear vocal quality b/t trials w/ no decline in respiratory status. Oral phase appeared [facility] except for bolus mananagement, mastication, and timely A-P transfer for swallowing. Oral clearing achieved w/ each bolus. OM exam was [facility]. No unilateral weakness noted. Noted intermittent Belching/Burping w/ the few po trials consumed. Much Time was given discussing Esophageal phase dysmotility and addressing s/s of Reflux via behavioral changes w/ recs. to f/u w/ ENT and GI for PPI management. Recommend continue Regluar diet(w/ mech soft meats, moistened foods) w/ thin liquids; general aspiration precautions.Rest Breaks during meals/oral intake to allow for Esophageal clearing and calming to avoid feeelings of N/V.REFLUX precautions strongly recommended to lessen chance for Regurgitation.Recommend pt f/u w/ GI for management of Reflux and tx as indicated; ENT. Discussion and Handouts given on Reflux; Esophageal dysmotility recs. NSG to reconsult ST services if any new needs while admitted.  SLP Visit Diagnosis: Dysphagia, unspecified (R13.10)    Aspiration Risk  (reduced following precs)    Diet Recommendation  Regular w/ cut meats, moistened foods; Thin liquids. General aspiration precautions; Strict Reflux precautions  Medication Administration: Whole meds with liquid(or whole in Puree if needed for ease of swallow)    Other  Recommendations Recommended Consults: Consider GI evaluation;Consider esophageal assessment(Dietician f/u) Oral Care Recommendations: Oral care BID;Oral care before and after PO;Patient independent with oral care Other Recommendations: (n/a)   Follow up Recommendations None      Frequency and Duration (n/a)  (n/a)       Prognosis Prognosis for Safe Diet Advancement: Good Barriers to Reach Goals: (n/a)      Swallow Study   General Date of Onset: 01/26/20 HPI: Pt is a 74 y.o. female with medical history significant  of hypertension, hyperlipidemia, hypothyroidism, depression, breast cancer (s/p of mastectomy and chemotherapy 1989), who presents with abnormal lab and generalized weakness.  Patient states that she has been having generalized weakness, fatigue, decreased appetite and oral intake for several months, which has been worsening progressively.  Patient was seen by PCP yesterday, and had blood work done at her primary doctor's office. She was told that she needed to come to the ER for her elevated WBCs and kidney function. She states that she had laryngeal ulcer, and was treated by ENT in March.  Because of this issue, she has been having poor appetite and decreased oral intake.  Per ENT note yesterday, severe throat pain previously and diagnosed a right laryngeal arytenoid ulcer. This was treated with reflux meds, prednisone, and clarithromycin, and at last visit 5 weeks ago the ulcer was resolving with just some residual mild redness. She says she feels her throat has continued to improve, denying sore throat. A significant ongoing issue has been poor appetite with chronic nausea and decreased taste, though the sense of taste has been improving. She says she can eat solid foods and they do go down without much difficulty, though at times she will start to cough and gag until she dry heaves or vomits. Per Direct Viewing, "TVC clear and mobile. Pachyderma of the posterior glottis typical of chronic reflux. Previous laryngeal ulcer healed with no residual erythema. Hypopharynx and Nasaopharynx and  tongue base are clear".  DG Esophagus(GI) revealed "Intermittent lower esophageal spasm without persisting stricture; mild Esophageal dysmotility".  Type of Study: Bedside Swallow Evaluation Previous Swallow Assessment: none Diet Prior to this Study: Regular;Thin liquids Temperature Spikes Noted: No(wbc 7.3) Respiratory Status: Room air History of Recent Intubation: No Behavior/Cognition: Pleasant  mood;Cooperative;Alert Oral Cavity Assessment: Within Functional Limits Oral Care Completed by SLP: Recent completion by staff Oral Cavity - Dentition: Adequate natural dentition Vision: Functional for self-feeding Self-Feeding Abilities: Able to feed self Patient Positioning: Upright in bed Baseline Vocal Quality: Normal Volitional Cough: Strong Volitional Swallow: Able to elicit    Oral/Motor/Sensory Function Overall Oral Motor/Sensory Function: Within functional limits   Ice Chips Ice chips: Not tested   Thin Liquid Thin Liquid: Within functional limits Presentation: Cup;Self Fed(7+ sips)    Nectar Thick Nectar Thick Liquid: Not tested   Honey Thick Honey Thick Liquid: Not tested   Puree Puree: Not tested   Solid     Solid: Within functional limits Presentation: Self Fed;Spoon(3+)       Orinda Kenner, MS, CCC-SLP Parisa Pinela 02/01/2020,12:35 PM

## 2020-02-01 NOTE — Progress Notes (Signed)
PROGRESS NOTE    KRISTELLE CAVALLARO  EHU:[id number] DOB: November 06, 1945 DOA: 01/26/2020 PCP: [place] Pink, MD      Brief Narrative:  Mrs. Bitting is a 74 y.o. F with HTN, hypothyroidism and remote BrCa who presented with several weeks progressive generalized weakness, nausea, as well as acute renal failure.  Patient has started to develop generalized weakness, anorexia, and malaise several weeks ago, roughly around the time she was diagnosed with a laryngeal ulcer.  Then recently she was seen by her PCP who ordered a BMP which was abnormal and she was sent to the ER due to her acute kidney injury.  In the ER, creatinine 8, up from 5 two days prior (BUN 67, baseline creatinine last November 0.8).  Renal US unremarkable.  CXR clear.  UA with heavy leukocytes.          Assessment & Plan:  Acute renal failure Urine output good, creatinine down to 3 today, post ATN diuresis occurring -closely trend Electrolyte    Hypokalemia due to post-ATN diuresis Patient K 2.7 today despite aggressive K supplementation -Supplement K  Intractable nausea and vomiting Esophagram yesterday showed no stricture, mass.  Thankfully this morning she was able to work with speech therapy and tolerate some solid foods.  Gastroenterology has evaluated the patient and thankfully will be able to arrange expedited outpatient work-up.         Hypertension Hyperlipidemia -Continue amlodipine -Hold candesartan, HCTZ -Continue Crestor  Hypothyroidism -Continue levothyroxine  GERD/reflux -Continue pantoprazole   Anemia of renal failure Hemoglobin stable        Disposition: Status is: Inpatient  Remains inpatient appropriate because: Severe electrolyte derangement  Dispo: The patient is from: Home              Anticipated d/c is to: Home              Anticipated d/c date is: Possibly tomorrow              Patient currently is not medically stable to d/c.  The patient's renal function is  improving, however she still has severe electrolyte derangement, will need aggressive potassium supplementation and close monitoring of electrolytes                MDM: The below labs and imaging reports reviewed and summarized above.  Medication management as above.       DVT prophylaxis: Heparin Code Status: FULL Family Communication: Husband at the bedside    Consultants:   Nephrology  IR  Procedures:   5/12 temporary HD catheter insertion   5/13 first dialysis session  5/15 dialysis catheter removal  Antimicrobials:      Culture data:              Subjective: Patient still unable to take anything solid by mouth without vomiting.  No fever, confusion.  She is making good urine output.  No respiratory distress or swelling.  Sinus congestion is resolved.        Objective: Vitals:   01/31/20 1230 01/31/20 1952 02/01/20 0424 02/01/20 1334  BP: (!) 156/76 (!) 150/78 (!) 148/73 (!) 145/83  Pulse: 70 70 68 79  Resp: 15 15 16 16   Temp: 98.3 F (36.8 C) 99.5 F (37.5 C) 99.1 F (37.3 C) 98.5 F (36.9 C)  TempSrc: Oral Oral Oral   SpO2: 97% 97% 99% 98%  Weight:      Height:        Intake/Output Summary (Last 24 hours) at  02/01/2020 1618 Last data filed at 02/01/2020 0900 Gross per 24 hour  Intake 940 ml  Output 2000 ml  Net -1060 ml   Filed Weights   01/26/20 1138 01/26/20 1957 01/28/20 0936  Weight: 65.8 kg 65.7 kg 65.7 kg    Examination: General appearance: Adult female, lying in bed, no acute distress, interactive     HEENT: Anicteric, conjunctival pink, lids and lashes normal.  No nasal deformity, discharge, or epistaxis.  Lips normal, oropharynx moist, dentures in place, no oral lesions, hearing normal. Skin: No suspicious rashes or lesions on the face, neck, arms, abdomen, and legs. Cardiac: RRR, no murmurs, no lower extremity edema Respiratory: Respiratory rate is normal, lungs clear without rales or wheezes Abdomen:  No tenderness in all quadrants.  She has voluntary guarding throughout.  No hepatosplenomegaly, no distention. MSK: Normal muscle bulk and tone. Neuro: Extraocular movements intact, moves all extremities with normal strength and coronation, speech fluent. Psych: Attention normal, affect normal, judgment insight appeared normal          Data Reviewed: I have personally reviewed following labs and imaging studies:  CBC: Recent Labs  Lab 01/27/20 0543 01/28/20 0505 01/29/20 0539 01/30/20 0553 02/01/20 0523  WBC 8.7 7.1 6.0 6.0 7.3  HGB 8.6* 8.9* 8.5* 8.4* 9.4*  HCT 25.9* 25.8* 24.9* 23.7* 26.4*  MCV 89.0 87.2 88.3 84.9 83.5  PLT 253 245 231 142* 568   Basic Metabolic Panel: Recent Labs  Lab 01/28/20 0505 01/28/20 0505 01/28/20 1020 01/29/20 0539 01/30/20 0553 01/31/20 0549 02/01/20 0523  NA 142  --   --  144 144 143 142  K 3.3*  --   --  2.8* 2.5* 2.4* 2.7*  CL 113*  --   --  114* 111 108 108  CO2 19*  --   --  24 23 25 25   GLUCOSE 85  --   --  80 89 82 84  BUN 65*  --   --  39* 33* 31* 25*  CREATININE 7.55*  --   --  5.32* 4.56* 3.77* 3.08*  CALCIUM 8.3*  --   --  8.3* 8.1* 8.7* 8.8*  MG  --   --   --   --  1.5*  --  2.2  PHOS 5.3*   < > 3.8 4.2 3.9 3.8 3.1   < > = values in this interval not displayed.   GFR: Estimated Creatinine Clearance: 14.7 mL/min (A) (by C-G formula based on SCr of 3.08 mg/dL (H)). Liver Function Tests: Recent Labs  Lab 01/26/20 1203 01/26/20 1203 01/28/20 0505 01/29/20 0539 01/30/20 0553 01/31/20 0549 02/01/20 0523  AST 15  --   --   --   --   --   --   ALT 11  --   --   --   --   --   --   ALKPHOS 55  --   --   --   --   --   --   BILITOT 1.0  --   --   --   --   --   --   PROT 7.8  --   --   --   --   --   --   ALBUMIN 3.4*   < > 2.9* 2.8* 2.7* 3.0* 3.1*   < > = values in this interval not displayed.   No results for input(s): LIPASE, AMYLASE in the last 168 hours. No results for input(s): AMMONIA in the last  168  hours. Coagulation Profile: No results for input(s): INR, PROTIME in the last 168 hours. Cardiac Enzymes: No results for input(s): CKTOTAL, CKMB, CKMBINDEX, TROPONINI in the last 168 hours. BNP (last 3 results) No results for input(s): PROBNP in the last 8760 hours. HbA1C: No results for input(s): HGBA1C in the last 72 hours. CBG: Recent Labs  Lab 01/28/20 0743 01/29/20 0736 01/30/20 0834 01/31/20 0801 02/01/20 0759  GLUCAP 78 80 76 70 76   Lipid Profile: No results for input(s): CHOL, HDL, LDLCALC, TRIG, CHOLHDL, LDLDIRECT in the last 72 hours. Thyroid Function Tests: No results for input(s): TSH, T4TOTAL, FREET4, T3FREE, THYROIDAB in the last 72 hours. Anemia Panel: No results for input(s): VITAMINB12, FOLATE, FERRITIN, TIBC, IRON, RETICCTPCT in the last 72 hours. Urine analysis:    Component Value Date/Time   COLORURINE YELLOW (A) 01/26/2020 1203   APPEARANCEUR CLOUDY (A) 01/26/2020 1203   LABSPEC 1.008 01/26/2020 1203   PHURINE 7.0 01/26/2020 1203   GLUCOSEU NEGATIVE 01/26/2020 1203   HGBUR SMALL (A) 01/26/2020 1203   BILIRUBINUR NEGATIVE 01/26/2020 1203   KETONESUR NEGATIVE 01/26/2020 1203   PROTEINUR 100 (A) 01/26/2020 1203   NITRITE NEGATIVE 01/26/2020 1203   LEUKOCYTESUR LARGE (A) 01/26/2020 1203   Sepsis Labs: @LABRCNTIP (procalcitonin:4,lacticacidven:4)  ) Recent Results (from the past 240 hour(s))  SARS Coronavirus 2 by RT PCR (hospital order, performed in [place] hospital lab) Nasopharyngeal Nasopharyngeal Swab     Status: None   Collection Time: 01/26/20  3:54 PM   Specimen: Nasopharyngeal Swab  Result Value Ref Range Status   SARS Coronavirus 2 NEGATIVE NEGATIVE Final    Comment: (NOTE) SARS-CoV-2 target nucleic acids are NOT DETECTED. The SARS-CoV-2 RNA is generally detectable in upper and lower respiratory specimens during the acute phase of infection. The lowest concentration of SARS-CoV-2 viral copies this assay can detect is 250 copies /  mL. A negative result does not preclude SARS-CoV-2 infection and should not be used as the sole basis for treatment or other patient management decisions.  A negative result may occur with improper specimen collection / handling, submission of specimen other than nasopharyngeal swab, presence of viral mutation(s) within the areas targeted by this assay, and inadequate number of viral copies (<250 copies / mL). A negative result must be combined with clinical observations, patient history, and epidemiological information. Fact Sheet for Patients:   StrictlyIdeas.no Fact Sheet for Healthcare Providers: BankingDealers.co.za This test is not yet approved or cleared  by the [place] FDA and has been authorized for detection and/or diagnosis of SARS-CoV-2 by FDA under an Emergency Use Authorization (EUA).  This EUA will remain in effect (meaning this test can be used) for the duration of the COVID-19 declaration under Section 564(b)(1) of the Act, 21 U.S.C. section 360bbb-3(b)(1), unless the authorization is terminated or revoked sooner. Performed at [facility], [address] [place], [place] [address]          Radiology Studies: DG ESOPHAGUS W DOUBLE CM (HD)  Result Date: 01/31/2020 CLINICAL DATA:  Vomiting after meals EXAM: ESOPHOGRAM / BARIUM SWALLOW / BARIUM TABLET STUDY TECHNIQUE: Combined double contrast and single contrast examination performed using effervescent crystals, thick barium liquid, and thin barium liquid. The patient was observed with fluoroscopy swallowing a 13 mm barium sulphate tablet. FLUOROSCOPY TIME:  Fluoroscopy Time:  1 minutes 12 seconds Radiation Exposure Index (if provided by the fluoroscopic device): 21.5 mGy Number of Acquired Spot Images: 22 COMPARISON:  None. FINDINGS: Decreased primary peristaltic wave with mild  intermittent tertiary contractions. There was contraction at the lower esophageal  sphincter with delay of the barium column within the mid and distal esophagus. The distal esophagus did relax allowing the column to pass into the stomach. No persisting stricture. No discrete mass or ulceration. No spontaneous gastroesophageal reflux. No hiatal hernia. At the conclusion of the study, a 13 mm barium tablet was administered. This passed without delay into the stomach. IMPRESSION: 1. Intermittent lower esophageal spasm without persisting stricture. 2. Mild esophageal dysmotility. Electronically Signed   By: Davina Poke D.O.   On: 01/31/2020 18:03        Scheduled Meds: . amLODipine  5 mg Oral Daily  . vitamin C  1,000 mg Oral Daily  . aspirin EC  81 mg Oral Daily  . B-complex with vitamin C  1 tablet Oral Daily  . calcium-vitamin D  1 tablet Oral Daily  . enoxaparin (LOVENOX) injection  30 mg Subcutaneous Q24H  . levothyroxine  88 mcg Oral Daily  . montelukast  10 mg Oral Daily  . multivitamin with minerals  1 tablet Oral Daily  . omega-3 acid ethyl esters  1 g Oral BID  . pantoprazole  40 mg Oral Daily  . potassium chloride  30 mEq Oral TID  . Ensure Max Protein  11 oz Oral BID  . vitamin E  400 Units Oral Daily   Continuous Infusions: . sodium chloride Stopped (01/31/20 1103)  . sodium chloride       LOS: 5 days    Time spent: 25 minutes    Edwin Dada, MD [facility] 02/01/2020, 4:18 PM     Please page though [place] or Epic secure chat:  For [employer], Adult nurse

## 2020-02-01 NOTE — Care Management Important Message (Signed)
Important Message  Patient Details  Name: Michelle Lambert MRN: [id number] Date of Birth: 24-Apr-1946   Medicare Important Message Given:  Yes     [name] 02/01/2020, 11:24 AM

## 2020-02-01 NOTE — Progress Notes (Signed)
Michelle [place]ame, MD [facility]   [address] [place], [place] [address] Phone: [phone] Fax : [phone]   Subjective: The patient states she is not having any more nausea or vomiting.  She does report that she continues to have a cough.  The patient had a barium esophagram that showed motility disorders but no fractures or narrowing.   Objective: Vital signs in last 24 hours: Vitals:   01/31/20 1230 01/31/20 1952 02/01/20 0424 02/01/20 1334  BP: (!) 156/76 (!) 150/78 (!) 148/73 (!) 145/83  Pulse: 70 70 68 79  Resp: 15 15 16 16   Temp: 98.3 F (36.8 C) 99.5 F (37.5 C) 99.1 F (37.3 C) 98.5 F (36.9 C)  TempSrc: Oral Oral Oral   SpO2: 97% 97% 99% 98%  Weight:      Height:       Weight change:   Intake/Output Summary (Last 24 hours) at 02/01/2020 1437 Last data filed at 02/01/2020 0900 Gross per 24 hour  Intake 1139.93 ml  Output 2300 ml  Net -1160.07 ml     Exam: Heart:: Regular rate and rhythm, S1S2 present or without murmur or extra heart sounds Lungs: normal and clear to auscultation and percussion Abdomen: soft, nontender, normal bowel sounds   Lab Results: @LABTEST2 @ Micro Results: Recent Results (from the past 240 hour(s))  SARS Coronavirus 2 by RT PCR (hospital order, performed in [place] hospital lab) Nasopharyngeal Nasopharyngeal Swab     Status: None   Collection Time: 01/26/20  3:54 PM   Specimen: Nasopharyngeal Swab  Result Value Ref Range Status   SARS Coronavirus 2 NEGATIVE NEGATIVE Final    Comment: (NOTE) SARS-CoV-2 target nucleic acids are NOT DETECTED. The SARS-CoV-2 RNA is generally detectable in upper and lower respiratory specimens during the acute phase of infection. The lowest concentration of SARS-CoV-2 viral copies this assay can detect is 250 copies / mL. A negative result does not preclude SARS-CoV-2 infection and should not be used as the sole basis for treatment or other patient management decisions.  A negative result  may occur with improper specimen collection / handling, submission of specimen other than nasopharyngeal swab, presence of viral mutation(s) within the areas targeted by this assay, and inadequate number of viral copies (<250 copies / mL). A negative result must be combined with clinical observations, patient history, and epidemiological information. Fact Sheet for Patients:   StrictlyIdeas.no Fact Sheet for Healthcare Providers: BankingDealers.co.za This test is not yet approved or cleared  by the [place] FDA and has been authorized for detection and/or diagnosis of SARS-CoV-2 by FDA under an Emergency Use Authorization (EUA).  This EUA will remain in effect (meaning this test can be used) for the duration of the COVID-19 declaration under Section 564(b)(1) of the Act, 21 U.S.C. section 360bbb-3(b)(1), unless the authorization is terminated or revoked sooner. Performed at [facility], [address] [place], [place] [address]    Studies/Results: [place] ESOPHAGUS W DOUBLE CM (HD)  Result Date: 01/31/2020 CLINICAL DATA:  Vomiting after meals EXAM: ESOPHOGRAM / BARIUM SWALLOW / BARIUM TABLET STUDY TECHNIQUE: Combined double contrast and single contrast examination performed using effervescent crystals, thick barium liquid, and thin barium liquid. The patient was observed with fluoroscopy swallowing a 13 mm barium sulphate tablet. FLUOROSCOPY TIME:  Fluoroscopy Time:  1 minutes 12 seconds Radiation Exposure Index (if provided by the fluoroscopic device): 21.5 mGy Number of Acquired Spot Images: 22 COMPARISON:  None. FINDINGS: Decreased primary peristaltic wave with mild intermittent tertiary contractions.  There was contraction at the lower esophageal sphincter with delay of the barium column within the mid and distal esophagus. The distal esophagus did relax allowing the column to pass into the stomach. No persisting stricture. No  discrete mass or ulceration. No spontaneous gastroesophageal reflux. No hiatal hernia. At the conclusion of the study, a 13 mm barium tablet was administered. This passed without delay into the stomach. IMPRESSION: 1. Intermittent lower esophageal spasm without persisting stricture. 2. Mild esophageal dysmotility. Electronically Signed   By: Davina Poke D.O.   On: 01/31/2020 18:03   Medications: I have reviewed the patient's current medications. Scheduled Meds: . amLODipine  5 mg Oral Daily  . vitamin C  1,000 mg Oral Daily  . aspirin EC  81 mg Oral Daily  . B-complex with vitamin C  1 tablet Oral Daily  . calcium-vitamin D  1 tablet Oral Daily  . enoxaparin (LOVENOX) injection  30 mg Subcutaneous Q24H  . feeding supplement (PRO-STAT SUGAR FREE 64)  30 mL Oral TID  . levothyroxine  88 mcg Oral Daily  . montelukast  10 mg Oral Daily  . multivitamin with minerals  1 tablet Oral Daily  . omega-3 acid ethyl esters  1 g Oral BID  . pantoprazole  40 mg Oral Daily  . potassium chloride  30 mEq Oral TID  . vitamin E  400 Units Oral Daily   Continuous Infusions: . sodium chloride Stopped (01/31/20 1103)  . sodium chloride     PRN Meds:.sodium chloride, acetaminophen, hydrALAZINE, ondansetron (ZOFRAN) IV, oxymetazoline, polyethylene glycol, promethazine **OR** promethazine **OR** promethazine, senna-docusate, sodium chloride   Assessment: Principal Problem:   AKI (acute kidney injury) ([place]) Active Problems:   Hypertension   Hyperlipidemia   Hypothyroidism   Leukocytosis   GERD (gastroesophageal reflux disease)   Acute renal failure (ARF) (HCC)   Malnutrition of moderate degree    Plan: This patient had nausea vomiting that has resolved and a barium esophagram that showed some motility disorders but no stricture or narrowing.  The patient has been told the results and wants to know if we are going to do an upper endoscopy on her since she states that she was told by ENT that she  has reflux.  The patient was told that she could have this as an outpatient but she is insistent that she wants it done while she is in the hospital.  The patient has already eaten today and it cannot be done today.  I have spoken to the hospitalist and he will discuss whether he would like to keep her for this procedure or have her done as an outpatient.   LOS: 5 days   [place]le [place] 02/01/2020, 2:37 PM Pager [phone] 7am-5pm  Check AMION for 5pm -7am coverage and on weekends

## 2020-02-01 NOTE — H&P (View-Only) (Signed)
[place]ame, MD [facility]   [address], [place], [place] [address] Phone: [phone] Fax : [phone]   Subjective: The patient states she is not having any more nausea or vomiting.  She does report that she continues to have a cough.  The patient had a barium esophagram that showed motility disorders but no fractures or narrowing.   Objective: Vital signs in last 24 hours: Vitals:   01/31/20 1230 01/31/20 1952 02/01/20 0424 02/01/20 1334  BP: (!) 156/76 (!) 150/78 (!) 148/73 (!) 145/83  Pulse: 70 70 68 79  Resp: 15 15 16 16   Temp: 98.3 F (36.8 C) 99.5 F (37.5 C) 99.1 F (37.3 C) 98.5 F (36.9 C)  TempSrc: Oral Oral Oral   SpO2: 97% 97% 99% 98%  Weight:      Height:       Weight change:   Intake/Output Summary (Last 24 hours) at 02/01/2020 1437 Last data filed at 02/01/2020 0900 Gross per 24 hour  Intake 1139.93 ml  Output 2300 ml  Net -1160.07 ml     Exam: Heart:: Regular rate and rhythm, S1S2 present or without murmur or extra heart sounds Lungs: normal and clear to auscultation and percussion Abdomen: soft, nontender, normal bowel sounds   Lab Results: @LABTEST2 @ Micro Results: Recent Results (from the past 240 hour(s))  SARS Coronavirus 2 by RT PCR (hospital order, performed in [place] hospital lab) Nasopharyngeal Nasopharyngeal Swab     Status: None   Collection Time: 01/26/20  3:54 PM   Specimen: Nasopharyngeal Swab  Result Value Ref Range Status   SARS Coronavirus 2 NEGATIVE NEGATIVE Final    Comment: (NOTE) SARS-CoV-2 target nucleic acids are NOT DETECTED. The SARS-CoV-2 RNA is generally detectable in upper and lower respiratory specimens during the acute phase of infection. The lowest concentration of SARS-CoV-2 viral copies this assay can detect is 250 copies / mL. A negative result does not preclude SARS-CoV-2 infection and should not be used as the sole basis for treatment or other patient management decisions.  A negative result  may occur with improper specimen collection / handling, submission of specimen other than nasopharyngeal swab, presence of viral mutation(s) within the areas targeted by this assay, and inadequate number of viral copies (<250 copies / mL). A negative result must be combined with clinical observations, patient history, and epidemiological information. Fact Sheet for Patients:   StrictlyIdeas.no Fact Sheet for Healthcare Providers: BankingDealers.co.za This test is not yet approved or cleared  by the [place] FDA and has been authorized for detection and/or diagnosis of SARS-CoV-2 by FDA under an Emergency Use Authorization (EUA).  This EUA will remain in effect (meaning this test can be used) for the duration of the COVID-19 declaration under Section 564(b)(1) of the Act, 21 U.S.C. section 360bbb-3(b)(1), unless the authorization is terminated or revoked sooner. Performed at [facility], [address] [place], [place] [address]    Studies/Results: [place] ESOPHAGUS W DOUBLE CM (HD)  Result Date: 01/31/2020 CLINICAL DATA:  Vomiting after meals EXAM: ESOPHOGRAM / BARIUM SWALLOW / BARIUM TABLET STUDY TECHNIQUE: Combined double contrast and single contrast examination performed using effervescent crystals, thick barium liquid, and thin barium liquid. The patient was observed with fluoroscopy swallowing a 13 mm barium sulphate tablet. FLUOROSCOPY TIME:  Fluoroscopy Time:  1 minutes 12 seconds Radiation Exposure Index (if provided by the fluoroscopic device): 21.5 mGy Number of Acquired Spot Images: 22 COMPARISON:  None. FINDINGS: Decreased primary peristaltic wave with mild intermittent tertiary contractions.  There was contraction at the lower esophageal sphincter with delay of the barium column within the mid and distal esophagus. The distal esophagus did relax allowing the column to pass into the stomach. No persisting stricture. No  discrete mass or ulceration. No spontaneous gastroesophageal reflux. No hiatal hernia. At the conclusion of the study, a 13 mm barium tablet was administered. This passed without delay into the stomach. IMPRESSION: 1. Intermittent lower esophageal spasm without persisting stricture. 2. Mild esophageal dysmotility. Electronically Signed   By: Davina Poke D.O.   On: 01/31/2020 18:03   Medications: I have reviewed the patient's current medications. Scheduled Meds: . amLODipine  5 mg Oral Daily  . vitamin C  1,000 mg Oral Daily  . aspirin EC  81 mg Oral Daily  . B-complex with vitamin C  1 tablet Oral Daily  . calcium-vitamin D  1 tablet Oral Daily  . enoxaparin (LOVENOX) injection  30 mg Subcutaneous Q24H  . feeding supplement (PRO-STAT SUGAR FREE 64)  30 mL Oral TID  . levothyroxine  88 mcg Oral Daily  . montelukast  10 mg Oral Daily  . multivitamin with minerals  1 tablet Oral Daily  . omega-3 acid ethyl esters  1 g Oral BID  . pantoprazole  40 mg Oral Daily  . potassium chloride  30 mEq Oral TID  . vitamin E  400 Units Oral Daily   Continuous Infusions: . sodium chloride Stopped (01/31/20 1103)  . sodium chloride     PRN Meds:.sodium chloride, acetaminophen, hydrALAZINE, ondansetron (ZOFRAN) IV, oxymetazoline, polyethylene glycol, promethazine **OR** promethazine **OR** promethazine, senna-docusate, sodium chloride   Assessment: Principal Problem:   AKI (acute kidney injury) ([place]) Active Problems:   Hypertension   Hyperlipidemia   Hypothyroidism   Leukocytosis   GERD (gastroesophageal reflux disease)   Acute renal failure (ARF) (HCC)   Malnutrition of moderate degree    Plan: This patient had nausea vomiting that has resolved and a barium esophagram that showed some motility disorders but no stricture or narrowing.  The patient has been told the results and wants to know if we are going to do an upper endoscopy on her since she states that she was told by ENT that she  has reflux.  The patient was told that she could have this as an outpatient but she is insistent that she wants it done while she is in the hospital.  The patient has already eaten today and it cannot be done today.  I have spoken to the hospitalist and he will discuss whether he would like to keep her for this procedure or have her done as an outpatient.   LOS: 5 days   Michelle [place] 02/01/2020, 2:37 PM Pager [phone] 7am-5pm  Check AMION for 5pm -7am coverage and on weekends

## 2020-02-01 NOTE — Progress Notes (Signed)
[facility]  ROUNDING NOTE   Subjective:   Husband at bedside.  Patient reports less nausea and vomiting but continues to have coughing and dry heaving episodes.  UOP 3700 Creatinine 3.08 (3.77)  Objective:  Vital signs in last 24 hours:  Temp:  [99.1 F (37.3 C)-99.5 F (37.5 C)] 99.1 F (37.3 C) (05/17 0424) Pulse Rate:  [68-70] 68 (05/17 0424) Resp:  [15-16] 16 (05/17 0424) BP: (148-150)/(73-78) 148/73 (05/17 0424) SpO2:  [97 %-99 %] 99 % (05/17 0424)  Weight change:  Filed Weights   01/26/20 1138 01/26/20 1957 01/28/20 0936  Weight: 65.8 kg 65.7 kg 65.7 kg    Intake/Output: I/O last 3 completed shifts: In: 1174.8 [P.O.:570; I.V.:504.8; Other:100] Out: 4750 [Urine:4750]   Intake/Output this shift:  Total I/O In: 480 [P.O.:480] Out: -   Physical Exam: General: No acute distress  Head: Normocephalic, atraumatic. Moist oral mucosal membranes  Eyes: Anicteric  Neck: Supple, trachea midline  Lungs:  Clear to auscultation, normal effort  Heart: regular  Abdomen:  Soft, nontender, bowel sounds present  Extremities: No peripheral edema.  Neurologic: Awake, alert, following commands  Skin: No lesions        Basic Metabolic Panel: Recent Labs  Lab 01/28/20 0505 01/28/20 0505 01/28/20 1020 01/29/20 0539 01/29/20 0539 01/30/20 0553 01/31/20 0549 02/01/20 0523  NA 142  --   --  144  --  144 143 142  K 3.3*  --   --  2.8*  --  2.5* 2.4* 2.7*  CL 113*  --   --  114*  --  111 108 108  CO2 19*  --   --  24  --  23 25 25   GLUCOSE 85  --   --  80  --  89 82 84  BUN 65*  --   --  39*  --  33* 31* 25*  CREATININE 7.55*  --   --  5.32*  --  4.56* 3.77* 3.08*  CALCIUM 8.3*   < >  --  8.3*   < > 8.1* 8.7* 8.8*  MG  --   --   --   --   --  1.5*  --  2.2  PHOS 5.3*   < > 3.8 4.2  --  3.9 3.8 3.1   < > = values in this interval not displayed.    Liver Function Tests: Recent Labs  Lab 01/26/20 1203 01/26/20 1203 01/28/20 0505 01/29/20 0539  01/30/20 0553 01/31/20 0549 02/01/20 0523  AST 15  --   --   --   --   --   --   ALT 11  --   --   --   --   --   --   ALKPHOS 55  --   --   --   --   --   --   BILITOT 1.0  --   --   --   --   --   --   PROT 7.8  --   --   --   --   --   --   ALBUMIN 3.4*   < > 2.9* 2.8* 2.7* 3.0* 3.1*   < > = values in this interval not displayed.   No results for input(s): LIPASE, AMYLASE in the last 168 hours. No results for input(s): AMMONIA in the last 168 hours.  CBC: Recent Labs  Lab 01/27/20 0543 01/28/20 0505 01/29/20 0539 01/30/20 0553 02/01/20 0523  WBC  8.7 7.1 6.0 6.0 7.3  HGB 8.6* 8.9* 8.5* 8.4* 9.4*  HCT 25.9* 25.8* 24.9* 23.7* 26.4*  MCV 89.0 87.2 88.3 84.9 83.5  PLT 253 245 231 142* 245    Cardiac Enzymes: No results for input(s): CKTOTAL, CKMB, CKMBINDEX, TROPONINI in the last 168 hours.  BNP: Invalid input(s): POCBNP  CBG: Recent Labs  Lab 01/28/20 0743 01/29/20 0736 01/30/20 0834 01/31/20 0801 02/01/20 0759  GLUCAP 78 80 76 70 76    Microbiology: Results for orders placed or performed during the hospital encounter of 01/26/20  SARS Coronavirus 2 by RT PCR (hospital order, performed in [facility] hospital lab) Nasopharyngeal Nasopharyngeal Swab     Status: None   Collection Time: 01/26/20  3:54 PM   Specimen: Nasopharyngeal Swab  Result Value Ref Range Status   SARS Coronavirus 2 NEGATIVE NEGATIVE Final    Comment: (NOTE) SARS-CoV-2 target nucleic acids are NOT DETECTED. The SARS-CoV-2 RNA is generally detectable in upper and lower respiratory specimens during the acute phase of infection. The lowest concentration of SARS-CoV-2 viral copies this assay can detect is 250 copies / mL. A negative result does not preclude SARS-CoV-2 infection and should not be used as the sole basis for treatment or other patient management decisions.  A negative result may occur with improper specimen collection / handling, submission of specimen other than nasopharyngeal  swab, presence of viral mutation(s) within the areas targeted by this assay, and inadequate number of viral copies (<250 copies / mL). A negative result must be combined with clinical observations, patient history, and epidemiological information. Fact Sheet for Patients:   StrictlyIdeas.no Fact Sheet for Healthcare Providers: BankingDealers.co.za This test is not yet approved or cleared  by the [place] FDA and has been authorized for detection and/or diagnosis of SARS-CoV-2 by FDA under an Emergency Use Authorization (EUA).  This EUA will remain in effect (meaning this test can be used) for the duration of the COVID-19 declaration under Section 564(b)(1) of the Act, 21 U.S.C. section 360bbb-3(b)(1), unless the authorization is terminated or revoked sooner. Performed at [facility], [address] [place], [place] [address]     Coagulation Studies: No results for input(s): LABPROT, INR in the last 72 hours.  Urinalysis: No results for input(s): COLORURINE, LABSPEC, PHURINE, GLUCOSEU, HGBUR, BILIRUBINUR, KETONESUR, PROTEINUR, UROBILINOGEN, NITRITE, LEUKOCYTESUR in the last 72 hours.  Invalid input(s): APPERANCEUR    Imaging: DG ESOPHAGUS W DOUBLE CM (HD)  Result Date: 01/31/2020 CLINICAL DATA:  Vomiting after meals EXAM: ESOPHOGRAM / BARIUM SWALLOW / BARIUM TABLET STUDY TECHNIQUE: Combined double contrast and single contrast examination performed using effervescent crystals, thick barium liquid, and thin barium liquid. The patient was observed with fluoroscopy swallowing a 13 mm barium sulphate tablet. FLUOROSCOPY TIME:  Fluoroscopy Time:  1 minutes 12 seconds Radiation Exposure Index (if provided by the fluoroscopic device): 21.5 mGy Number of Acquired Spot Images: 22 COMPARISON:  None. FINDINGS: Decreased primary peristaltic wave with mild intermittent tertiary contractions. There was contraction at the lower esophageal  sphincter with delay of the barium column within the mid and distal esophagus. The distal esophagus did relax allowing the column to pass into the stomach. No persisting stricture. No discrete mass or ulceration. No spontaneous gastroesophageal reflux. No hiatal hernia. At the conclusion of the study, a 13 mm barium tablet was administered. This passed without delay into the stomach. IMPRESSION: 1. Intermittent lower esophageal spasm without persisting stricture. 2. Mild esophageal dysmotility. Electronically Signed   By: Davina Poke  D.O.   On: 01/31/2020 18:03     Medications:   . sodium chloride Stopped (01/31/20 1103)  . sodium chloride     . amLODipine  5 mg Oral Daily  . vitamin C  1,000 mg Oral Daily  . aspirin EC  81 mg Oral Daily  . B-complex with vitamin C  1 tablet Oral Daily  . calcium-vitamin D  1 tablet Oral Daily  . enoxaparin (LOVENOX) injection  30 mg Subcutaneous Q24H  . feeding supplement (PRO-STAT SUGAR FREE 64)  30 mL Oral TID  . levothyroxine  88 mcg Oral Daily  . montelukast  10 mg Oral Daily  . multivitamin with minerals  1 tablet Oral Daily  . omega-3 acid ethyl esters  1 g Oral BID  . pantoprazole  40 mg Oral Daily  . potassium chloride  30 mEq Oral TID  . vitamin E  400 Units Oral Daily   sodium chloride, acetaminophen, hydrALAZINE, ondansetron (ZOFRAN) IV, oxymetazoline, polyethylene glycol, promethazine **OR** promethazine **OR** promethazine, senna-docusate, sodium chloride  Assessment/ Plan:  74 y.o.white female with seasonal allergies, left breast cancer, depression, hyperlipidemia, hypertension, hypothyroidism, rheumatic fever, hypothyroidism, recent laryngeal ulcer, who was admitted to [facility] on 01/26/2020 for AKI (acute kidney injury) ([place]) [N17.9] Acute renal failure (ARF) ([place]) [N17.9]   1.  Acute kidney injury: required hemodialysis treatment. Baseline creatinine of 0.8 on 07/2019 2.  Microscopic hematuria. 3.  Proteinuria. 4.  Metabolic  acidosis. 5.  Anemia with renal failure. 6. Hypokalemia  Plan:   Secondary to prerenal azotemia from poor PO intake and vomiting   Nonoliguric urine output. Creatinine and renal function improving. No indication for dialysis at this time.  Serologic work up negative   Hypokalemia secondary to post ATN diuresis - Continue to encourage PO intake.  - Replace potassium and magnesium.    LOS: 5 Sarath Kolluru 5/17/202112:42 PM

## 2020-02-02 LAB — CBC
HCT: 27.7 % — ABNORMAL LOW (ref 36.0–46.0)
Hemoglobin: 9.4 g/dL — ABNORMAL LOW (ref 12.0–15.0)
MCH: 29.3 pg (ref 26.0–34.0)
MCHC: 33.9 g/dL (ref 30.0–36.0)
MCV: 86.3 fL (ref 80.0–100.0)
Platelets: 246 10*3/uL (ref 150–400)
RBC: 3.21 MIL/uL — ABNORMAL LOW (ref 3.87–5.11)
RDW: 12.3 % (ref 11.5–15.5)
WBC: 8.2 10*3/uL (ref 4.0–10.5)
nRBC: 0 % (ref 0.0–0.2)

## 2020-02-02 LAB — RENAL FUNCTION PANEL
Albumin: 3.1 g/dL — ABNORMAL LOW (ref 3.5–5.0)
Anion gap: 8 (ref 5–15)
BUN: 23 mg/dL (ref 8–23)
CO2: 23 mmol/L (ref 22–32)
Calcium: 8.8 mg/dL — ABNORMAL LOW (ref 8.9–10.3)
Chloride: 107 mmol/L (ref 98–111)
Creatinine, Ser: 2.73 mg/dL — ABNORMAL HIGH (ref 0.44–1.00)
GFR calc Af Amer: 19 mL/min — ABNORMAL LOW (ref >60)
GFR calc non Af Amer: 17 mL/min — ABNORMAL LOW (ref >60)
Glucose, Bld: 84 mg/dL (ref 70–99)
Phosphorus: 2.5 mg/dL (ref 2.5–4.6)
Potassium: 3.2 mmol/L — ABNORMAL LOW (ref 3.5–5.1)
Sodium: 138 mmol/L (ref 135–145)

## 2020-02-02 LAB — GLUCOSE, CAPILLARY: Glucose-Capillary: 76 mg/dL (ref 70–99)

## 2020-02-02 MED ORDER — AMLODIPINE BESYLATE 5 MG PO TABS: 5.0000 mg | ORAL_TABLET | Freq: Every day | ORAL | 3 refills | Status: DC

## 2020-02-02 MED ORDER — PROMETHAZINE HCL 25 MG PO TABS: 25.0000 mg | ORAL_TABLET | Freq: Four times a day (QID) | ORAL | 1 refills | Status: DC | PRN

## 2020-02-02 MED ORDER — PROMETHAZINE HCL 25 MG PO TABS: 25.0000 mg | ORAL_TABLET | Freq: Four times a day (QID) | ORAL | 1 refills | Status: AC | PRN

## 2020-02-02 MED ORDER — PANTOPRAZOLE SODIUM 40 MG PO TBEC: 40.0000 mg | DELAYED_RELEASE_TABLET | Freq: Every day | ORAL | 3 refills | Status: DC

## 2020-02-02 MED ORDER — POTASSIUM CHLORIDE ER 10 MEQ PO CPCR: 20.0000 meq | ORAL_CAPSULE | Freq: Two times a day (BID) | ORAL | 0 refills | Status: DC

## 2020-02-02 MED ORDER — POTASSIUM CHLORIDE ER 10 MEQ PO CPCR: 20.0000 meq | ORAL_CAPSULE | Freq: Two times a day (BID) | ORAL | 0 refills | Status: AC

## 2020-02-02 MED ORDER — PANTOPRAZOLE SODIUM 40 MG PO TBEC: 40.0000 mg | DELAYED_RELEASE_TABLET | Freq: Every day | ORAL | 3 refills | Status: AC

## 2020-02-02 MED ORDER — AMLODIPINE BESYLATE 5 MG PO TABS: 5.0000 mg | ORAL_TABLET | Freq: Every day | ORAL | 3 refills | Status: AC

## 2020-02-02 NOTE — Discharge Summary (Signed)
Physician Discharge Summary  Michelle KNUDSEN OYD:[id number] DOB: 1946-01-04 DOA: 01/26/2020  PCP: Maryland Pink, MD  Admit date: 01/26/2020 Discharge date: 02/02/2020  Admitted From: Home  Disposition:  Home   Recommendations for Outpatient Follow-up:  1. Follow up with PCP Dr. Kary Kos in 1-2 weeks 2. Follow up with Nephrology Dr. Juleen China in 1-2 weeks 3. Please obtain BMP/CBC in one week 4. Follow up with GI for endoscopy later this week     Home Health: None  Equipment/Devices: None  Discharge Condition: Good  CODE STATUS: FULL Diet recommendation: Regular  Brief/Interim Summary: Michelle Lambert is a 74 y.o. F with HTN, hypothyroidism and remote BrCa who presented with several weeks progressive generalized weakness, nausea, as well as acute renal failure.  Patient has started to develop generalized weakness, anorexia, and malaise several weeks ago, roughly around the time she was diagnosed with a laryngeal ulcer.  Then recently she was seen by her PCP who ordered a BMP which was abnormal and she was sent to the ER due to her acute kidney injury.  In the ER, creatinine 8, up from 5 two days prior (BUN 67, baseline creatinine last November 0.8).  Renal US unremarkable.  CXR clear.  UA with heavy leukocytes.     PRINCIPAL HOSPITAL DIAGNOSIS: Acute renal failure    Discharge Diagnoses:   Acute renal failure Patient presented with several weeks symptoms, serum creatinine >8 mg/dL on admission.   History negative for NSAIDs (maybe once or twice per week), no recent contrast exposure.  Uses ARB and HCTZ.  Omeprazole associated with AIN and UA with many white cells.    Started on IV fluids and had excellent urine output.  Underwent HD once, but creatinine monitored and urine output was excellent and BUN dropped appropriately.  Post-ATN electrolyte diuresis was treated.  -Needs close follow up with Nephrology -Potassium supplement at discharge    Intractable nausea and  vomiting Esophagram obtained, showed no stricture, mass.  She was able to improve her nausea with Phenergan and also able to tolerate solid food well on her last 48 hours in the hospital.  Gastroenterology has evaluated the patient and thankfully will be able to arrange expedited outpatient work-up.         Hypertension Hyperlipidemia Continue amlodipine, Crestor.  Hold candesartan, HCTZ.   Hypothyroidism Continue levothyroxine  GERD/reflux Continue pantoprazole   Anemia of renal failure Hemoglobin stable           Discharge Instructions  Discharge Instructions    Discharge instructions   Complete by: As directed    From Dr. Loleta Books: You were admitted for kidney failure. Thankfully, your kidneys improved a lot.  Drink plenty of fluids. STOP taking your Atacand-HCT for now (the candesartan HCTZ blood pressure medicine)  Instead take amlodipine 5 mg daily for blood pressure  Because the after effects of the kidneys waking up causes you to lose potassium, you should take a potassium supplement this week: Take potassium chloride 20 mEq (two tabs) twice daily  Take in the afternoon and evening Ask the pharmacist if they have recommendations for the smallest size alternatives, any alternative formulation is okay Follow up with Dr. Juleen China He has sent a message to his office, and they will reach out to you.  If you haven't heard from them by Thursday, call at the number listed below in the To Do section.    For the stomach, Dr. Allen Norris has sent a message to his CMA, and she should be reaching out  to you today or tomorrow to arrange the endoscopy.  His office number is also below.  For your heart burn, stop omeprazole and start pantoprazole.  Dr. Kary Kos can refill this.   Increase activity slowly   Complete by: As directed      Allergies as of 02/02/2020   No Known Allergies     Medication List    STOP taking these medications    candesartan-hydrochlorothiazide 16-12.5 MG tablet Commonly known as: ATACAND HCT   doxycycline 100 MG capsule Commonly known as: VIBRAMYCIN   omeprazole 40 MG capsule Commonly known as: PRILOSEC Replaced by: pantoprazole 40 MG tablet     TAKE these medications   amLODipine 5 MG tablet Commonly known as: NORVASC Take 1 tablet (5 mg total) by mouth daily.   aspirin 81 MG EC tablet Take 81 mg by mouth daily.   b complex vitamins tablet Take 1 tablet by mouth daily.   calcium-vitamin D 500-400 MG-UNIT tablet Commonly known as: OSCAL-500 Take 1 tablet by mouth daily.   levothyroxine 88 MCG tablet Commonly known as: SYNTHROID Take 88 mcg by mouth daily.   montelukast 10 MG tablet Commonly known as: SINGULAIR Take 10 mg by mouth daily.   Multi-Vitamin tablet Take by mouth.   omega-3 acid ethyl esters 1 g capsule Commonly known as: LOVAZA Take 1 g by mouth 2 (two) times daily.   pantoprazole 40 MG tablet Commonly known as: PROTONIX Take 1 tablet (40 mg total) by mouth daily. Replaces: omeprazole 40 MG capsule   potassium chloride 10 MEQ CR capsule Commonly known as: MICRO-K Take 2 capsules (20 mEq total) by mouth 2 (two) times daily.   promethazine 25 MG tablet Commonly known as: PHENERGAN Take 1 tablet (25 mg total) by mouth every 6 (six) hours as needed for nausea.   rosuvastatin 20 MG tablet Commonly known as: CRESTOR Take 20 mg by mouth daily.   vitamin C 1000 MG tablet Take 1,000 mg by mouth daily.   vitamin E 180 MG (400 UNITS) capsule Take 400 Units by mouth daily.      Follow-up Information    Maryland Pink, MD. Go on 02/08/2020.   Specialty: Family Medicine Why: 9:45am appointment Contact information: [address] [place] [address] [phone]          No Known Allergies  Consultations:  Gastroenterology  Nephrology  ENT   Procedures/Studies: DG Chest 2 View  Result Date: 01/26/2020 CLINICAL  DATA:  Shortness of breath EXAM: CHEST - 2 VIEW COMPARISON:  By report from 01/25/2020 FINDINGS: Cardiac shadow is within normal limits. Left breast implant is noted. Left axillary node dissection is seen. The lungs are clear bilaterally. No focal infiltrate or effusion is seen. No acute bony abnormality is noted. IMPRESSION: No acute abnormality noted. Electronically Signed   By: Inez Catalina M.D.   On: 01/26/2020 15:13   US RENAL  Result Date: 01/26/2020 CLINICAL DATA:  Acute kidney injury EXAM: RENAL / URINARY TRACT ULTRASOUND COMPLETE COMPARISON:  None. FINDINGS: Right Kidney: Renal measurements: 12.2 x 6.2 x 5.9 cm = volume: 237 mL. Diffusely increased echotexture. No mass or hydronephrosis. Left Kidney: Renal measurements: 11.9 x 6.0 x 5.5 cm = volume: 203 mL. Diffusely increased echotexture. No mass or hydronephrosis. Bladder: Appears normal for degree of bladder distention. Other: None. IMPRESSION: No acute findings.  No hydronephrosis. Mildly increased echotexture throughout the kidneys suggesting chronic medical renal disease. Electronically Signed   By: Rolm Baptise M.D.  On: 01/26/2020 18:47   PERIPHERAL VASCULAR CATHETERIZATION  Result Date: 01/27/2020 See op note  DG ESOPHAGUS W DOUBLE CM (HD)  Result Date: 01/31/2020 CLINICAL DATA:  Vomiting after meals EXAM: ESOPHOGRAM / BARIUM SWALLOW / BARIUM TABLET STUDY TECHNIQUE: Combined double contrast and single contrast examination performed using effervescent crystals, thick barium liquid, and thin barium liquid. The patient was observed with fluoroscopy swallowing a 13 mm barium sulphate tablet. FLUOROSCOPY TIME:  Fluoroscopy Time:  1 minutes 12 seconds Radiation Exposure Index (if provided by the fluoroscopic device): 21.5 mGy Number of Acquired Spot Images: 22 COMPARISON:  None. FINDINGS: Decreased primary peristaltic wave with mild intermittent tertiary contractions. There was contraction at the lower esophageal sphincter with delay of the  barium column within the mid and distal esophagus. The distal esophagus did relax allowing the column to pass into the stomach. No persisting stricture. No discrete mass or ulceration. No spontaneous gastroesophageal reflux. No hiatal hernia. At the conclusion of the study, a 13 mm barium tablet was administered. This passed without delay into the stomach. IMPRESSION: 1. Intermittent lower esophageal spasm without persisting stricture. 2. Mild esophageal dysmotility. Electronically Signed   By: Davina Poke D.O.   On: 01/31/2020 18:03       Subjective: Feeling better.  Mild nausea with K supplements.  No confusion, fever.  Urine output good.  Discharge Exam: Vitals:   02/01/20 1334 02/01/20 2015  BP: (!) 145/83 (!) 154/80  Pulse: 79 75  Resp: 16 20  Temp: 98.5 F (36.9 C) 98.4 F (36.9 C)  SpO2: 98% 99%   Vitals:   01/31/20 1952 02/01/20 0424 02/01/20 1334 02/01/20 2015  BP: (!) 150/78 (!) 148/73 (!) 145/83 (!) 154/80  Pulse: 70 68 79 75  Resp: 15 16 16 20   Temp: 99.5 F (37.5 C) 99.1 F (37.3 C) 98.5 F (36.9 C) 98.4 F (36.9 C)  TempSrc: Oral Oral  Oral  SpO2: 97% 99% 98% 99%  Weight:      Height:        General: Pt is alert, awake, not in acute distress Cardiovascular: RRR, nl S1-S2, no murmurs appreciated.   No LE edema.   Respiratory: Normal respiratory rate and rhythm.  CTAB without rales or wheezes. Abdominal: Abdomen soft and non-tender.  No distension or HSM.   Neuro/Psych: Strength symmetric in upper and lower extremities.  Judgment and insight appear normal.   The results of significant diagnostics from this hospitalization (including imaging, microbiology, ancillary and laboratory) are listed below for reference.     Microbiology: Recent Results (from the past 240 hour(s))  SARS Coronavirus 2 by RT PCR (hospital order, performed in [facility] hospital lab) Nasopharyngeal Nasopharyngeal Swab     Status: None   Collection Time: 01/26/20  3:54 PM    Specimen: Nasopharyngeal Swab  Result Value Ref Range Status   SARS Coronavirus 2 NEGATIVE NEGATIVE Final    Comment: (NOTE) SARS-CoV-2 target nucleic acids are NOT DETECTED. The SARS-CoV-2 RNA is generally detectable in upper and lower respiratory specimens during the acute phase of infection. The lowest concentration of SARS-CoV-2 viral copies this assay can detect is 250 copies / mL. A negative result does not preclude SARS-CoV-2 infection and should not be used as the sole basis for treatment or other patient management decisions.  A negative result may occur with improper specimen collection / handling, submission of specimen other than nasopharyngeal swab, presence of viral mutation(s) within the areas targeted by this assay, and inadequate number of  viral copies (<250 copies / mL). A negative result must be combined with clinical observations, patient history, and epidemiological information. Fact Sheet for Patients:   StrictlyIdeas.no Fact Sheet for Healthcare Providers: BankingDealers.co.za This test is not yet approved or cleared  by the [place] FDA and has been authorized for detection and/or diagnosis of SARS-CoV-2 by FDA under an Emergency Use Authorization (EUA).  This EUA will remain in effect (meaning this test can be used) for the duration of the COVID-19 declaration under Section 564(b)(1) of the Act, 21 U.S.C. section 360bbb-3(b)(1), unless the authorization is terminated or revoked sooner. Performed at [facility], [address] [place], [place] [address]      Labs: BNP (last 3 results) No results for input(s): BNP in the last 8760 hours. Basic Metabolic Panel: Recent Labs  Lab 01/29/20 0539 01/30/20 0553 01/31/20 0549 02/01/20 0523 02/02/20 0448  NA 144 144 143 142 138  K 2.8* 2.5* 2.4* 2.7* 3.2*  CL 114* 111 108 108 107  CO2 24 23 25 25 23   GLUCOSE 80 89 82 84 84  BUN 39* 33* 31* 25*  23  CREATININE 5.32* 4.56* 3.77* 3.08* 2.73*  CALCIUM 8.3* 8.1* 8.7* 8.8* 8.8*  MG  --  1.5*  --  2.2  --   PHOS 4.2 3.9 3.8 3.1 2.5   Liver Function Tests: Recent Labs  Lab 01/29/20 0539 01/30/20 0553 01/31/20 0549 02/01/20 0523 02/02/20 0448  ALBUMIN 2.8* 2.7* 3.0* 3.1* 3.1*   No results for input(s): LIPASE, AMYLASE in the last 168 hours. No results for input(s): AMMONIA in the last 168 hours. CBC: Recent Labs  Lab 01/28/20 0505 01/29/20 0539 01/30/20 0553 02/01/20 0523 02/02/20 0448  WBC 7.1 6.0 6.0 7.3 8.2  HGB 8.9* 8.5* 8.4* 9.4* 9.4*  HCT 25.8* 24.9* 23.7* 26.4* 27.7*  MCV 87.2 88.3 84.9 83.5 86.3  PLT 245 231 142* 245 246   Cardiac Enzymes: No results for input(s): CKTOTAL, CKMB, CKMBINDEX, TROPONINI in the last 168 hours. BNP: Invalid input(s): POCBNP CBG: Recent Labs  Lab 01/29/20 0736 01/30/20 0834 01/31/20 0801 02/01/20 0759 02/02/20 0747  GLUCAP 80 76 70 76 76   D-Dimer No results for input(s): DDIMER in the last 72 hours. Hgb A1c No results for input(s): HGBA1C in the last 72 hours. Lipid Profile No results for input(s): CHOL, HDL, LDLCALC, TRIG, CHOLHDL, LDLDIRECT in the last 72 hours. Thyroid function studies No results for input(s): TSH, T4TOTAL, T3FREE, THYROIDAB in the last 72 hours.  Invalid input(s): FREET3 Anemia work up No results for input(s): VITAMINB12, FOLATE, FERRITIN, TIBC, IRON, RETICCTPCT in the last 72 hours. Urinalysis    Component Value Date/Time   COLORURINE YELLOW (A) 01/26/2020 1203   APPEARANCEUR CLOUDY (A) 01/26/2020 1203   LABSPEC 1.008 01/26/2020 1203   PHURINE 7.0 01/26/2020 1203   GLUCOSEU NEGATIVE 01/26/2020 1203   HGBUR SMALL (A) 01/26/2020 1203   BILIRUBINUR NEGATIVE 01/26/2020 [place] 01/26/2020 1203   PROTEINUR 100 (A) 01/26/2020 1203   NITRITE NEGATIVE 01/26/2020 1203   LEUKOCYTESUR LARGE (A) 01/26/2020 1203   Sepsis Labs Invalid input(s): PROCALCITONIN,  WBC,   LACTICIDVEN Microbiology Recent Results (from the past 240 hour(s))  SARS Coronavirus 2 by RT PCR (hospital order, performed in [place] hospital lab) Nasopharyngeal Nasopharyngeal Swab     Status: None   Collection Time: 01/26/20  3:54 PM   Specimen: Nasopharyngeal Swab  Result Value Ref Range Status   SARS Coronavirus 2 NEGATIVE NEGATIVE Final  Comment: (NOTE) SARS-CoV-2 target nucleic acids are NOT DETECTED. The SARS-CoV-2 RNA is generally detectable in upper and lower respiratory specimens during the acute phase of infection. The lowest concentration of SARS-CoV-2 viral copies this assay can detect is 250 copies / mL. A negative result does not preclude SARS-CoV-2 infection and should not be used as the sole basis for treatment or other patient management decisions.  A negative result may occur with improper specimen collection / handling, submission of specimen other than nasopharyngeal swab, presence of viral mutation(s) within the areas targeted by this assay, and inadequate number of viral copies (<250 copies / mL). A negative result must be combined with clinical observations, patient history, and epidemiological information. Fact Sheet for Patients:   StrictlyIdeas.no Fact Sheet for Healthcare Providers: BankingDealers.co.za This test is not yet approved or cleared  by the [place] FDA and has been authorized for detection and/or diagnosis of SARS-CoV-2 by FDA under an Emergency Use Authorization (EUA).  This EUA will remain in effect (meaning this test can be used) for the duration of the COVID-19 declaration under Section 564(b)(1) of the Act, 21 U.S.C. section 360bbb-3(b)(1), unless the authorization is terminated or revoked sooner. Performed at [facility], [address], [place], [place] [address]      Time coordinating discharge: 35 minutes      SIGNED:   Edwin Dada,  MD  [facility] 02/02/2020, 5:38 PM

## 2020-02-02 NOTE — Plan of Care (Signed)
Discharge order received. Patient mental status is at baseline. Vital signs stable . No signs of acute distress. Discharge instructions given. Patient verbalized understanding. No other issues noted at this time.   

## 2020-02-02 NOTE — Progress Notes (Signed)
[facility]  ROUNDING NOTE   Subjective:   Husband at bedside.    Objective:  Vital signs in last 24 hours:  Temp:  [98.4 F (36.9 C)-98.5 F (36.9 C)] 98.4 F (36.9 C) (05/17 2015) Pulse Rate:  [75-79] 75 (05/17 2015) Resp:  [16-20] 20 (05/17 2015) BP: (145-154)/(80-83) 154/80 (05/17 2015) SpO2:  [98 %-99 %] 99 % (05/17 2015)  Weight change:  Filed Weights   01/26/20 1138 01/26/20 1957 01/28/20 0936  Weight: 65.8 kg 65.7 kg 65.7 kg    Intake/Output: I/O last 3 completed shifts: In: 480 [P.O.:480] Out: 2600 [Urine:2600]   Intake/Output this shift:  Total I/O In: 240 [P.O.:240] Out: 200 [Urine:200]  Physical Exam: General: No acute distress  Head: Normocephalic, atraumatic. Moist oral mucosal membranes  Eyes: Anicteric  Neck: Supple, trachea midline  Lungs:  Clear to auscultation, normal effort  Heart: regular  Abdomen:  Soft, nontender, bowel sounds present  Extremities: No peripheral edema.  Neurologic: Awake, alert, following commands  Skin: No lesions        Basic Metabolic Panel: Recent Labs  Lab 01/29/20 0539 01/29/20 0539 01/30/20 0553 01/30/20 0553 01/31/20 0549 02/01/20 0523 02/02/20 0448  NA 144  --  144  --  143 142 138  K 2.8*  --  2.5*  --  2.4* 2.7* 3.2*  CL 114*  --  111  --  108 108 107  CO2 24  --  23  --  25 25 23   GLUCOSE 80  --  89  --  82 84 84  BUN 39*  --  33*  --  31* 25* 23  CREATININE 5.32*  --  4.56*  --  3.77* 3.08* 2.73*  CALCIUM 8.3*   < > 8.1*   < > 8.7* 8.8* 8.8*  MG  --   --  1.5*  --   --  2.2  --   PHOS 4.2  --  3.9  --  3.8 3.1 2.5   < > = values in this interval not displayed.    Liver Function Tests: Recent Labs  Lab 01/29/20 0539 01/30/20 0553 01/31/20 0549 02/01/20 0523 02/02/20 0448  ALBUMIN 2.8* 2.7* 3.0* 3.1* 3.1*   No results for input(s): LIPASE, AMYLASE in the last 168 hours. No results for input(s): AMMONIA in the last 168 hours.  CBC: Recent Labs  Lab 01/28/20 0505  01/29/20 0539 01/30/20 0553 02/01/20 0523 02/02/20 0448  WBC 7.1 6.0 6.0 7.3 8.2  HGB 8.9* 8.5* 8.4* 9.4* 9.4*  HCT 25.8* 24.9* 23.7* 26.4* 27.7*  MCV 87.2 88.3 84.9 83.5 86.3  PLT 245 231 142* 245 246    Cardiac Enzymes: No results for input(s): CKTOTAL, CKMB, CKMBINDEX, TROPONINI in the last 168 hours.  BNP: Invalid input(s): POCBNP  CBG: Recent Labs  Lab 01/29/20 0736 01/30/20 0834 01/31/20 0801 02/01/20 0759 02/02/20 0747  GLUCAP 80 76 70 76 76    Microbiology: Results for orders placed or performed during the hospital encounter of 01/26/20  SARS Coronavirus 2 by RT PCR (hospital order, performed in [facility] hospital lab) Nasopharyngeal Nasopharyngeal Swab     Status: None   Collection Time: 01/26/20  3:54 PM   Specimen: Nasopharyngeal Swab  Result Value Ref Range Status   SARS Coronavirus 2 NEGATIVE NEGATIVE Final    Comment: (NOTE) SARS-CoV-2 target nucleic acids are NOT DETECTED. The SARS-CoV-2 RNA is generally detectable in upper and lower respiratory specimens during the acute phase of infection. The lowest  concentration of SARS-CoV-2 viral copies this assay can detect is 250 copies / mL. A negative result does not preclude SARS-CoV-2 infection and should not be used as the sole basis for treatment or other patient management decisions.  A negative result may occur with improper specimen collection / handling, submission of specimen other than nasopharyngeal swab, presence of viral mutation(s) within the areas targeted by this assay, and inadequate number of viral copies (<250 copies / mL). A negative result must be combined with clinical observations, patient history, and epidemiological information. Fact Sheet for Patients:   StrictlyIdeas.no Fact Sheet for Healthcare Providers: BankingDealers.co.za This test is not yet approved or cleared  by the [place] FDA and has been authorized for detection  and/or diagnosis of SARS-CoV-2 by FDA under an Emergency Use Authorization (EUA).  This EUA will remain in effect (meaning this test can be used) for the duration of the COVID-19 declaration under Section 564(b)(1) of the Act, 21 U.S.C. section 360bbb-3(b)(1), unless the authorization is terminated or revoked sooner. Performed at [facility], [address] [place], [place] [address]     Coagulation Studies: No results for input(s): LABPROT, INR in the last 72 hours.  Urinalysis: No results for input(s): COLORURINE, LABSPEC, PHURINE, GLUCOSEU, HGBUR, BILIRUBINUR, KETONESUR, PROTEINUR, UROBILINOGEN, NITRITE, LEUKOCYTESUR in the last 72 hours.  Invalid input(s): APPERANCEUR    Imaging: DG ESOPHAGUS W DOUBLE CM (HD)  Result Date: 01/31/2020 CLINICAL DATA:  Vomiting after meals EXAM: ESOPHOGRAM / BARIUM SWALLOW / BARIUM TABLET STUDY TECHNIQUE: Combined double contrast and single contrast examination performed using effervescent crystals, thick barium liquid, and thin barium liquid. The patient was observed with fluoroscopy swallowing a 13 mm barium sulphate tablet. FLUOROSCOPY TIME:  Fluoroscopy Time:  1 minutes 12 seconds Radiation Exposure Index (if provided by the fluoroscopic device): 21.5 mGy Number of Acquired Spot Images: 22 COMPARISON:  None. FINDINGS: Decreased primary peristaltic wave with mild intermittent tertiary contractions. There was contraction at the lower esophageal sphincter with delay of the barium column within the mid and distal esophagus. The distal esophagus did relax allowing the column to pass into the stomach. No persisting stricture. No discrete mass or ulceration. No spontaneous gastroesophageal reflux. No hiatal hernia. At the conclusion of the study, a 13 mm barium tablet was administered. This passed without delay into the stomach. IMPRESSION: 1. Intermittent lower esophageal spasm without persisting stricture. 2. Mild esophageal dysmotility.  Electronically Signed   By: Davina Poke D.O.   On: 01/31/2020 18:03     Medications:   . sodium chloride Stopped (01/31/20 1103)  . sodium chloride     . amLODipine  5 mg Oral Daily  . vitamin C  1,000 mg Oral Daily  . aspirin EC  81 mg Oral Daily  . B-complex with vitamin C  1 tablet Oral Daily  . calcium-vitamin D  1 tablet Oral Daily  . enoxaparin (LOVENOX) injection  30 mg Subcutaneous Q24H  . levothyroxine  88 mcg Oral Daily  . montelukast  10 mg Oral Daily  . multivitamin with minerals  1 tablet Oral Daily  . omega-3 acid ethyl esters  1 g Oral BID  . pantoprazole  40 mg Oral Daily  . Ensure Max Protein  11 oz Oral BID  . vitamin E  400 Units Oral Daily   sodium chloride, acetaminophen, hydrALAZINE, ondansetron (ZOFRAN) IV, oxymetazoline, polyethylene glycol, promethazine **OR** promethazine **OR** promethazine, senna-docusate, sodium chloride  Assessment/ Plan:  74 y.o.white female with seasonal allergies, left breast  cancer, depression, hyperlipidemia, hypertension, hypothyroidism, rheumatic fever, hypothyroidism, recent laryngeal ulcer, who was admitted to [facility] on 01/26/2020 for AKI (acute kidney injury) ([place]) [N17.9] Acute renal failure (ARF) ([place]) [N17.9]   1.  Acute kidney injury: required hemodialysis treatment. Baseline creatinine of 0.8 on 07/2019 2.  Microscopic hematuria. 3.  Proteinuria. 4.  Metabolic acidosis. 5.  Anemia with renal failure. 6. Hypokalemia  Plan:   Secondary to prerenal azotemia from poor PO intake and vomiting   Nonoliguric urine output. Creatinine and renal function improving. No indication for dialysis at this time.  Serologic work up negative   Hypokalemia secondary to post ATN diuresis  Follow up with Nephrology next week.    LOS: 6 Karianna Gusman 5/18/202112:44 PM

## 2020-02-04 DIAGNOSIS — N179 Acute kidney failure, unspecified: Secondary | ICD-10-CM | POA: Diagnosis not present

## 2020-02-05 ENCOUNTER — Other Ambulatory Visit: Payer: Self-pay

## 2020-02-05 ENCOUNTER — Telehealth: Payer: Self-pay

## 2020-02-05 DIAGNOSIS — K2100 Gastro-esophageal reflux disease with esophagitis, without bleeding: Secondary | ICD-10-CM

## 2020-02-05 NOTE — Telephone Encounter (Signed)
Left vm for pt to return my call to schedule EGD.  

## 2020-02-08 DIAGNOSIS — K219 Gastro-esophageal reflux disease without esophagitis: Secondary | ICD-10-CM | POA: Diagnosis not present

## 2020-02-08 DIAGNOSIS — N179 Acute kidney failure, unspecified: Secondary | ICD-10-CM | POA: Diagnosis not present

## 2020-02-08 NOTE — Telephone Encounter (Signed)
Pt has been scheduled for an EGD with Dr. Allen Norris on 02/11/20.

## 2020-02-09 ENCOUNTER — Other Ambulatory Visit
Admission: RE | Admit: 2020-02-09 | Discharge: 2020-02-09 | Disposition: A | Discharge status: Home / Self Care | Payer: PPO | Source: Ambulatory Visit | Attending: Gastroenterology | Admitting: Gastroenterology

## 2020-02-09 ENCOUNTER — Other Ambulatory Visit: Payer: Self-pay

## 2020-02-09 DIAGNOSIS — Z01812 Encounter for preprocedural laboratory examination: Secondary | ICD-10-CM | POA: Insufficient documentation

## 2020-02-09 DIAGNOSIS — Z20822 Contact with and (suspected) exposure to covid-19: Secondary | ICD-10-CM | POA: Diagnosis not present

## 2020-02-09 LAB — SARS CORONAVIRUS 2 (TAT 6-24 HRS): SARS Coronavirus 2: NEGATIVE

## 2020-02-10 DIAGNOSIS — E876 Hypokalemia: Secondary | ICD-10-CM | POA: Diagnosis not present

## 2020-02-10 DIAGNOSIS — R319 Hematuria, unspecified: Secondary | ICD-10-CM | POA: Diagnosis not present

## 2020-02-10 DIAGNOSIS — R809 Proteinuria, unspecified: Secondary | ICD-10-CM | POA: Diagnosis not present

## 2020-02-10 DIAGNOSIS — N179 Acute kidney failure, unspecified: Secondary | ICD-10-CM | POA: Diagnosis not present

## 2020-02-10 DIAGNOSIS — E872 Acidosis: Secondary | ICD-10-CM | POA: Diagnosis not present

## 2020-02-10 DIAGNOSIS — I10 Essential (primary) hypertension: Secondary | ICD-10-CM | POA: Diagnosis not present

## 2020-02-11 ENCOUNTER — Ambulatory Visit
Admission: RE | Admit: 2020-02-11 | Discharge: 2020-02-11 | Disposition: A | Discharge status: Home / Self Care | Payer: PPO | Attending: Gastroenterology | Admitting: Gastroenterology

## 2020-02-11 ENCOUNTER — Ambulatory Visit: Payer: PPO | Admitting: Certified Registered"

## 2020-02-11 ENCOUNTER — Encounter: Payer: Self-pay | Admitting: Gastroenterology

## 2020-02-11 ENCOUNTER — Encounter
Admission: RE | Disposition: A | Discharge status: Home / Self Care | Payer: Self-pay | Source: Home / Self Care | Attending: Gastroenterology

## 2020-02-11 DIAGNOSIS — E440 Moderate protein-calorie malnutrition: Secondary | ICD-10-CM | POA: Insufficient documentation

## 2020-02-11 DIAGNOSIS — Z7982 Long term (current) use of aspirin: Secondary | ICD-10-CM | POA: Diagnosis not present

## 2020-02-11 DIAGNOSIS — Z87891 Personal history of nicotine dependence: Secondary | ICD-10-CM | POA: Diagnosis not present

## 2020-02-11 DIAGNOSIS — E039 Hypothyroidism, unspecified: Secondary | ICD-10-CM | POA: Insufficient documentation

## 2020-02-11 DIAGNOSIS — Z7901 Long term (current) use of anticoagulants: Secondary | ICD-10-CM | POA: Insufficient documentation

## 2020-02-11 DIAGNOSIS — Z9221 Personal history of antineoplastic chemotherapy: Secondary | ICD-10-CM | POA: Diagnosis not present

## 2020-02-11 DIAGNOSIS — I10 Essential (primary) hypertension: Secondary | ICD-10-CM | POA: Insufficient documentation

## 2020-02-11 DIAGNOSIS — K317 Polyp of stomach and duodenum: Secondary | ICD-10-CM | POA: Insufficient documentation

## 2020-02-11 DIAGNOSIS — Z6827 Body mass index (BMI) 27.0-27.9, adult: Secondary | ICD-10-CM | POA: Diagnosis not present

## 2020-02-11 DIAGNOSIS — K2100 Gastro-esophageal reflux disease with esophagitis, without bleeding: Secondary | ICD-10-CM

## 2020-02-11 DIAGNOSIS — R05 Cough: Secondary | ICD-10-CM | POA: Insufficient documentation

## 2020-02-11 DIAGNOSIS — Z79890 Hormone replacement therapy (postmenopausal): Secondary | ICD-10-CM | POA: Diagnosis not present

## 2020-02-11 DIAGNOSIS — K219 Gastro-esophageal reflux disease without esophagitis: Secondary | ICD-10-CM | POA: Diagnosis not present

## 2020-02-11 HISTORY — PX: ESOPHAGOGASTRODUODENOSCOPY (EGD) WITH PROPOFOL: SHX5813

## 2020-02-11 SURGERY — ESOPHAGOGASTRODUODENOSCOPY (EGD) WITH PROPOFOL
Anesthesia: General

## 2020-02-11 MED ORDER — LIDOCAINE HCL (CARDIAC) PF 100 MG/5ML IV SOSY
PREFILLED_SYRINGE | INTRAVENOUS | Status: DC | PRN
  Administered 2020-02-11: 100 mg via INTRAVENOUS

## 2020-02-11 MED ORDER — GLYCOPYRROLATE 0.2 MG/ML IJ SOLN
INTRAMUSCULAR | Status: DC | PRN
  Administered 2020-02-11: .2 mg via INTRAVENOUS

## 2020-02-11 MED ORDER — PROPOFOL 10 MG/ML IV BOLUS
INTRAVENOUS | Status: DC | PRN
  Administered 2020-02-11: 50 mg via INTRAVENOUS

## 2020-02-11 MED ORDER — SODIUM CHLORIDE 0.9 % IV SOLN: INTRAVENOUS | Status: DC

## 2020-02-11 NOTE — Anesthesia Preprocedure Evaluation (Addendum)
Anesthesia Evaluation  Patient identified by MRN, date of birth, ID band Patient awake    Reviewed: Allergy & Precautions, H&P , NPO status , Patient's Chart, lab work & pertinent test results, reviewed documented beta blocker date and time   History of Anesthesia Complications Negative for: history of anesthetic complications  Airway Mallampati: II   Neck ROM: full    Dental  (+) Poor Dentition, Dental Advidsory Given   Pulmonary neg pulmonary ROS, former smoker,    Pulmonary exam normal        Cardiovascular Exercise Tolerance: Good hypertension, On Medications (-) angina(-) Past MI and (-) Cardiac Stents Normal cardiovascular exam(-) dysrhythmias (-) Valvular Problems/Murmurs Rhythm:regular Rate:Normal     Neuro/Psych PSYCHIATRIC DISORDERS Depression negative neurological ROS  negative psych ROS   GI/Hepatic Neg liver ROS, GERD  ,  Endo/Other  negative endocrine ROSneg diabetesHypothyroidism   Renal/GU ARFRenal disease (from dehydration)  negative genitourinary   Musculoskeletal   Abdominal   Peds  Hematology negative hematology ROS (+)   Anesthesia Other Findings Past Medical History: No date: Allergy     Comment:  seasonal No date: Cancer [facility])     Comment:  left/chemo 1989 No date: Depression No date: Hyperlipidemia No date: Hypertension No date: Hypothyroidism No date: Rheumatic fever No date: Thyroid disease Past Surgical History: 06/13/2006: BREAST BIOPSY; Right     Comment:  benign 1982: BREAST EXCISIONAL BIOPSY; Right     Comment:  benign No date: BREAST IMPLANT REMOVAL     Comment:  left with replacement implant 2011 No date: BREAST RECONSTRUCTION     Comment:  [phone] left No date: COLONOSCOPY No date: MASTECTOMY; Left     Comment:  left 1989 No date: POLYPECTOMY BMI    Body Mass Index:  28.17 kg/m     Reproductive/Obstetrics negative OB ROS                             Anesthesia Physical  Anesthesia Plan  ASA: II  Anesthesia Plan: General   Post-op Pain Management:    Induction: Intravenous  PONV Risk Score and Plan: 3 and Propofol infusion and TIVA  Airway Management Planned: Nasal Cannula and Natural Airway  Additional Equipment:   Intra-op Plan:   Post-operative Plan:   Informed Consent: I have reviewed the patients History and Physical, chart, labs and discussed the procedure including the risks, benefits and alternatives for the proposed anesthesia with the patient or authorized representative who has indicated his/her understanding and acceptance.     Dental Advisory Given  Plan Discussed with: CRNA  Anesthesia Plan Comments:        Anesthesia Quick Evaluation

## 2020-02-11 NOTE — Anesthesia Postprocedure Evaluation (Signed)
Anesthesia Post Note  Patient: Michelle Lambert  Procedure(s) Performed: ESOPHAGOGASTRODUODENOSCOPY (EGD) WITH PROPOFOL (N/A )  Patient location during evaluation: Endoscopy Anesthesia Type: General Level of consciousness: awake and alert Pain management: pain level controlled Vital Signs Assessment: post-procedure vital signs reviewed and stable Respiratory status: spontaneous breathing, nonlabored ventilation, respiratory function stable and patient connected to nasal cannula oxygen Cardiovascular status: blood pressure returned to baseline and stable Postop Assessment: no apparent nausea or vomiting Anesthetic complications: no     Last Vitals:  Vitals:   02/11/20 0953 02/11/20 1003  BP: (!) 134/95 (!) 143/89  Pulse: 80 79  Resp: 20 13  Temp:    SpO2: 95% 97%    Last Pain:  Vitals:   02/11/20 1003  TempSrc:   PainSc: 0-No pain                 Martha Clan

## 2020-02-11 NOTE — Op Note (Signed)
[facility] Gastroenterology Patient Name: Michelle Lambert Procedure Date: 02/11/2020 9:09 AM MRN: [id number] Account #: [id number] Date of Birth: 01-10-1946 Admit Type: Outpatient Age: 74 Room: [facility] ENDO ROOM 4 Gender: Female Note Status: Finalized Procedure:             Upper GI endoscopy Indications:           Suspected esophageal reflux Providers:             Lucilla Lame MD, MD Medicines:             Propofol per Anesthesia Complications:         No immediate complications. Procedure:             Pre-Anesthesia Assessment:                        - Prior to the procedure, a History and Physical was                         performed, and patient medications and allergies were                         reviewed. The patient's tolerance of previous                         anesthesia was also reviewed. The risks and benefits                         of the procedure and the sedation options and risks                         were discussed with the patient. All questions were                         answered, and informed consent was obtained. Prior                         Anticoagulants: The patient has taken no previous                         anticoagulant or antiplatelet agents. ASA Grade                         Assessment: II - A patient with mild systemic disease.                         After reviewing the risks and benefits, the patient                         was deemed in satisfactory condition to undergo the                         procedure.                        After obtaining informed consent, the endoscope was                         passed under direct vision. Throughout the procedure,  the patient's blood pressure, pulse, and oxygen                         saturations were monitored continuously. The Endoscope                         was introduced through the mouth, and advanced to the                         second part of  duodenum. The upper GI endoscopy was                         accomplished without difficulty. The patient tolerated                         the procedure well. Findings:      The Z-line was irregular and was found at the gastroesophageal junction.      The stomach was normal.      A single 9 mm sessile polyp with no bleeding was found in the duodenal       bulb. Biopsies were taken with a cold forceps for histology. Impression:            - Z-line irregular, at the gastroesophageal junction.                        - Normal stomach.                        - A single duodenal polyp. Biopsied. Recommendation:        - Discharge patient to home.                        - Resume previous diet.                        - Continue present medications.                        - Await pathology results.                        - Use a proton pump inhibitor PO daily. Procedure Code(s):     --- Professional ---                        [phone], Esophagogastroduodenoscopy, flexible,                         transoral; with biopsy, single or multiple Diagnosis Code(s):     --- Professional ---                        K31.7, Polyp of stomach and duodenum CPT copyright 2019 American [employer] Association. All rights reserved. The codes documented in this report are preliminary and upon coder review may  be revised to meet current compliance requirements. Lucilla Lame MD, MD 02/11/2020 9:43:18 AM This report has been signed electronically. Number of Addenda: 0 Note Initiated On: 02/11/2020 9:09 AM Estimated Blood Loss:  Estimated blood loss: none.      [facility]

## 2020-02-11 NOTE — Interval H&P Note (Signed)
History and Physical Interval Note:  02/11/2020 9:08 AM  Michelle Lambert  has presented today for surgery, with the diagnosis of GERD K21.9.  The various methods of treatment have been discussed with the patient and family. After consideration of risks, benefits and other options for treatment, the patient has consented to  Procedure(s): ESOPHAGOGASTRODUODENOSCOPY (EGD) WITH PROPOFOL (N/A) as a surgical intervention.  The patient's history has been reviewed, patient examined, no change in status, stable for surgery.  I have reviewed the patient's chart and labs.  Questions were answered to the patient's satisfaction.     Yurani Fettes [employer]

## 2020-02-11 NOTE — Transfer of Care (Signed)
Immediate Anesthesia Transfer of Care Note  Patient: Michelle Lambert  Procedure(s) Performed: ESOPHAGOGASTRODUODENOSCOPY (EGD) WITH PROPOFOL (N/A )  Patient Location: Endoscopy Unit  Anesthesia Type:General  Level of Consciousness: drowsy, patient cooperative and responds to stimulation  Airway & Oxygen Therapy: Patient Spontanous Breathing and Patient connected to face mask oxygen  Post-op Assessment: Report given to RN and Post -op Vital signs reviewed and stable  Post vital signs: Reviewed and stable  Last Vitals:  Vitals Value Taken Time  BP 147/67 02/11/20 0943  Temp    Pulse 76 02/11/20 0944  Resp 23 02/11/20 0944  SpO2 97 % 02/11/20 0944  Vitals shown include unvalidated device data.  Last Pain:  Vitals:   02/11/20 0905  TempSrc: Temporal  PainSc: 0-No pain         Complications: No apparent anesthesia complications

## 2020-02-12 ENCOUNTER — Encounter: Payer: Self-pay | Admitting: *Deleted

## 2020-02-12 LAB — SURGICAL PATHOLOGY

## 2020-02-16 ENCOUNTER — Encounter: Payer: Self-pay | Admitting: Gastroenterology

## 2020-03-18 DIAGNOSIS — E782 Mixed hyperlipidemia: Secondary | ICD-10-CM | POA: Diagnosis not present

## 2020-03-18 DIAGNOSIS — I10 Essential (primary) hypertension: Secondary | ICD-10-CM | POA: Diagnosis not present

## 2020-03-18 DIAGNOSIS — E079 Disorder of thyroid, unspecified: Secondary | ICD-10-CM | POA: Diagnosis not present

## 2020-03-18 DIAGNOSIS — Z131 Encounter for screening for diabetes mellitus: Secondary | ICD-10-CM | POA: Diagnosis not present

## 2020-03-24 DIAGNOSIS — I10 Essential (primary) hypertension: Secondary | ICD-10-CM | POA: Diagnosis not present

## 2020-03-24 DIAGNOSIS — E872 Acidosis: Secondary | ICD-10-CM | POA: Diagnosis not present

## 2020-03-24 DIAGNOSIS — R319 Hematuria, unspecified: Secondary | ICD-10-CM | POA: Diagnosis not present

## 2020-03-24 DIAGNOSIS — E876 Hypokalemia: Secondary | ICD-10-CM | POA: Diagnosis not present

## 2020-03-24 DIAGNOSIS — N179 Acute kidney failure, unspecified: Secondary | ICD-10-CM | POA: Diagnosis not present

## 2020-03-24 DIAGNOSIS — R809 Proteinuria, unspecified: Secondary | ICD-10-CM | POA: Diagnosis not present

## 2020-03-29 DIAGNOSIS — Z0000 Encounter for general adult medical examination without abnormal findings: Secondary | ICD-10-CM | POA: Diagnosis not present

## 2020-03-29 DIAGNOSIS — E079 Disorder of thyroid, unspecified: Secondary | ICD-10-CM | POA: Diagnosis not present

## 2020-03-29 DIAGNOSIS — J309 Allergic rhinitis, unspecified: Secondary | ICD-10-CM | POA: Diagnosis not present

## 2020-03-29 DIAGNOSIS — R05 Cough: Secondary | ICD-10-CM | POA: Diagnosis not present

## 2020-03-29 DIAGNOSIS — E785 Hyperlipidemia, unspecified: Secondary | ICD-10-CM | POA: Diagnosis not present

## 2020-03-29 DIAGNOSIS — I10 Essential (primary) hypertension: Secondary | ICD-10-CM | POA: Diagnosis not present

## 2020-03-31 DIAGNOSIS — N1831 Chronic kidney disease, stage 3a: Secondary | ICD-10-CM | POA: Diagnosis not present

## 2020-03-31 DIAGNOSIS — N179 Acute kidney failure, unspecified: Secondary | ICD-10-CM | POA: Diagnosis not present

## 2020-03-31 DIAGNOSIS — I10 Essential (primary) hypertension: Secondary | ICD-10-CM | POA: Diagnosis not present

## 2020-03-31 DIAGNOSIS — R809 Proteinuria, unspecified: Secondary | ICD-10-CM | POA: Diagnosis not present

## 2020-04-05 DIAGNOSIS — H18413 Arcus senilis, bilateral: Secondary | ICD-10-CM | POA: Diagnosis not present

## 2020-04-05 DIAGNOSIS — E7800 Pure hypercholesterolemia, unspecified: Secondary | ICD-10-CM | POA: Diagnosis not present

## 2020-04-05 DIAGNOSIS — H40013 Open angle with borderline findings, low risk, bilateral: Secondary | ICD-10-CM | POA: Diagnosis not present

## 2020-04-05 DIAGNOSIS — I10 Essential (primary) hypertension: Secondary | ICD-10-CM | POA: Diagnosis not present

## 2020-04-05 DIAGNOSIS — H25813 Combined forms of age-related cataract, bilateral: Secondary | ICD-10-CM | POA: Diagnosis not present

## 2020-04-05 DIAGNOSIS — H35033 Hypertensive retinopathy, bilateral: Secondary | ICD-10-CM | POA: Diagnosis not present

## 2020-04-05 DIAGNOSIS — H3554 Dystrophies primarily involving the retinal pigment epithelium: Secondary | ICD-10-CM | POA: Diagnosis not present

## 2020-04-14 DIAGNOSIS — N179 Acute kidney failure, unspecified: Secondary | ICD-10-CM | POA: Diagnosis not present

## 2020-04-14 DIAGNOSIS — N1831 Chronic kidney disease, stage 3a: Secondary | ICD-10-CM | POA: Diagnosis not present

## 2020-04-14 DIAGNOSIS — I10 Essential (primary) hypertension: Secondary | ICD-10-CM | POA: Diagnosis not present

## 2020-04-14 DIAGNOSIS — R809 Proteinuria, unspecified: Secondary | ICD-10-CM | POA: Diagnosis not present

## 2020-04-25 DIAGNOSIS — Z20822 Contact with and (suspected) exposure to covid-19: Secondary | ICD-10-CM | POA: Diagnosis not present

## 2020-05-10 DIAGNOSIS — E079 Disorder of thyroid, unspecified: Secondary | ICD-10-CM | POA: Diagnosis not present

## 2020-05-24 DIAGNOSIS — H35033 Hypertensive retinopathy, bilateral: Secondary | ICD-10-CM | POA: Diagnosis not present

## 2020-05-24 DIAGNOSIS — H18413 Arcus senilis, bilateral: Secondary | ICD-10-CM | POA: Diagnosis not present

## 2020-05-24 DIAGNOSIS — H3554 Dystrophies primarily involving the retinal pigment epithelium: Secondary | ICD-10-CM | POA: Diagnosis not present

## 2020-05-24 DIAGNOSIS — H11153 Pinguecula, bilateral: Secondary | ICD-10-CM | POA: Diagnosis not present

## 2020-05-24 DIAGNOSIS — H40013 Open angle with borderline findings, low risk, bilateral: Secondary | ICD-10-CM | POA: Diagnosis not present

## 2020-05-24 DIAGNOSIS — H35341 Macular cyst, hole, or pseudohole, right eye: Secondary | ICD-10-CM | POA: Diagnosis not present

## 2020-05-24 DIAGNOSIS — I10 Essential (primary) hypertension: Secondary | ICD-10-CM | POA: Diagnosis not present

## 2020-05-24 DIAGNOSIS — E7800 Pure hypercholesterolemia, unspecified: Secondary | ICD-10-CM | POA: Diagnosis not present

## 2020-05-24 DIAGNOSIS — H25813 Combined forms of age-related cataract, bilateral: Secondary | ICD-10-CM | POA: Diagnosis not present

## 2020-05-24 DIAGNOSIS — H04123 Dry eye syndrome of bilateral lacrimal glands: Secondary | ICD-10-CM | POA: Diagnosis not present

## 2020-06-02 DIAGNOSIS — J309 Allergic rhinitis, unspecified: Secondary | ICD-10-CM | POA: Diagnosis not present

## 2020-06-02 DIAGNOSIS — J300 Vasomotor rhinitis: Secondary | ICD-10-CM | POA: Diagnosis not present

## 2020-06-02 DIAGNOSIS — R05 Cough: Secondary | ICD-10-CM | POA: Diagnosis not present

## 2020-06-24 DIAGNOSIS — H35371 Puckering of macula, right eye: Secondary | ICD-10-CM | POA: Diagnosis not present

## 2020-06-24 DIAGNOSIS — H43811 Vitreous degeneration, right eye: Secondary | ICD-10-CM | POA: Diagnosis not present

## 2020-06-24 DIAGNOSIS — H35341 Macular cyst, hole, or pseudohole, right eye: Secondary | ICD-10-CM | POA: Diagnosis not present

## 2020-06-24 DIAGNOSIS — H2513 Age-related nuclear cataract, bilateral: Secondary | ICD-10-CM | POA: Diagnosis not present

## 2020-08-29 DIAGNOSIS — E079 Disorder of thyroid, unspecified: Secondary | ICD-10-CM | POA: Diagnosis not present

## 2020-08-29 DIAGNOSIS — E785 Hyperlipidemia, unspecified: Secondary | ICD-10-CM | POA: Diagnosis not present

## 2020-08-29 DIAGNOSIS — I10 Essential (primary) hypertension: Secondary | ICD-10-CM | POA: Diagnosis not present

## 2020-08-29 DIAGNOSIS — E876 Hypokalemia: Secondary | ICD-10-CM | POA: Diagnosis not present

## 2020-09-19 DIAGNOSIS — N179 Acute kidney failure, unspecified: Secondary | ICD-10-CM | POA: Diagnosis not present

## 2020-09-19 DIAGNOSIS — N1831 Chronic kidney disease, stage 3a: Secondary | ICD-10-CM | POA: Diagnosis not present

## 2020-09-19 DIAGNOSIS — R809 Proteinuria, unspecified: Secondary | ICD-10-CM | POA: Diagnosis not present

## 2020-09-19 DIAGNOSIS — I10 Essential (primary) hypertension: Secondary | ICD-10-CM | POA: Diagnosis not present

## 2020-09-26 DIAGNOSIS — R809 Proteinuria, unspecified: Secondary | ICD-10-CM | POA: Diagnosis not present

## 2020-09-26 DIAGNOSIS — I10 Essential (primary) hypertension: Secondary | ICD-10-CM | POA: Diagnosis not present

## 2020-09-26 DIAGNOSIS — N1831 Chronic kidney disease, stage 3a: Secondary | ICD-10-CM | POA: Diagnosis not present

## 2020-11-04 ENCOUNTER — Other Ambulatory Visit: Payer: Self-pay | Admitting: Obstetrics and Gynecology

## 2020-11-04 DIAGNOSIS — Z1231 Encounter for screening mammogram for malignant neoplasm of breast: Secondary | ICD-10-CM

## 2020-12-26 ENCOUNTER — Inpatient Hospital Stay: Admission: RE | Admit: 2020-12-26 | Payer: PPO | Source: Ambulatory Visit

## 2021-01-03 DIAGNOSIS — K5900 Constipation, unspecified: Secondary | ICD-10-CM | POA: Diagnosis not present

## 2021-01-03 DIAGNOSIS — L900 Lichen sclerosus et atrophicus: Secondary | ICD-10-CM | POA: Diagnosis not present

## 2021-01-03 DIAGNOSIS — Z1231 Encounter for screening mammogram for malignant neoplasm of breast: Secondary | ICD-10-CM | POA: Diagnosis not present

## 2021-01-03 DIAGNOSIS — Z124 Encounter for screening for malignant neoplasm of cervix: Secondary | ICD-10-CM | POA: Diagnosis not present

## 2021-02-17 ENCOUNTER — Ambulatory Visit
Admission: RE | Admit: 2021-02-17 | Discharge: 2021-02-17 | Disposition: A | Discharge status: Home / Self Care | Payer: PPO | Source: Ambulatory Visit | Attending: Obstetrics and Gynecology | Admitting: Obstetrics and Gynecology

## 2021-02-17 ENCOUNTER — Other Ambulatory Visit: Payer: Self-pay

## 2021-02-17 DIAGNOSIS — Z1231 Encounter for screening mammogram for malignant neoplasm of breast: Secondary | ICD-10-CM

## 2021-02-21 DIAGNOSIS — C50112 Malignant neoplasm of central portion of left female breast: Secondary | ICD-10-CM | POA: Diagnosis not present

## 2021-02-21 DIAGNOSIS — Z4432 Encounter for fitting and adjustment of external left breast prosthesis: Secondary | ICD-10-CM | POA: Diagnosis not present

## 2021-03-28 DIAGNOSIS — I10 Essential (primary) hypertension: Secondary | ICD-10-CM | POA: Diagnosis not present

## 2021-03-28 DIAGNOSIS — E785 Hyperlipidemia, unspecified: Secondary | ICD-10-CM | POA: Diagnosis not present

## 2021-03-28 DIAGNOSIS — E079 Disorder of thyroid, unspecified: Secondary | ICD-10-CM | POA: Diagnosis not present

## 2021-04-03 DIAGNOSIS — R82998 Other abnormal findings in urine: Secondary | ICD-10-CM | POA: Diagnosis not present

## 2021-04-03 DIAGNOSIS — N1831 Chronic kidney disease, stage 3a: Secondary | ICD-10-CM | POA: Diagnosis not present

## 2021-04-03 DIAGNOSIS — N179 Acute kidney failure, unspecified: Secondary | ICD-10-CM | POA: Diagnosis not present

## 2021-04-03 DIAGNOSIS — I10 Essential (primary) hypertension: Secondary | ICD-10-CM | POA: Diagnosis not present

## 2021-04-03 DIAGNOSIS — R809 Proteinuria, unspecified: Secondary | ICD-10-CM | POA: Diagnosis not present

## 2021-04-04 DIAGNOSIS — F3342 Major depressive disorder, recurrent, in full remission: Secondary | ICD-10-CM | POA: Diagnosis not present

## 2021-04-04 DIAGNOSIS — E785 Hyperlipidemia, unspecified: Secondary | ICD-10-CM | POA: Diagnosis not present

## 2021-04-04 DIAGNOSIS — Z0000 Encounter for general adult medical examination without abnormal findings: Secondary | ICD-10-CM | POA: Diagnosis not present

## 2021-04-04 DIAGNOSIS — I10 Essential (primary) hypertension: Secondary | ICD-10-CM | POA: Diagnosis not present

## 2021-04-04 DIAGNOSIS — E079 Disorder of thyroid, unspecified: Secondary | ICD-10-CM | POA: Diagnosis not present

## 2021-04-04 DIAGNOSIS — Z1211 Encounter for screening for malignant neoplasm of colon: Secondary | ICD-10-CM | POA: Diagnosis not present

## 2021-04-10 DIAGNOSIS — R319 Hematuria, unspecified: Secondary | ICD-10-CM | POA: Diagnosis not present

## 2021-04-10 DIAGNOSIS — E876 Hypokalemia: Secondary | ICD-10-CM | POA: Diagnosis not present

## 2021-04-10 DIAGNOSIS — N179 Acute kidney failure, unspecified: Secondary | ICD-10-CM | POA: Diagnosis not present

## 2021-04-10 DIAGNOSIS — E872 Acidosis: Secondary | ICD-10-CM | POA: Diagnosis not present

## 2021-04-10 DIAGNOSIS — I10 Essential (primary) hypertension: Secondary | ICD-10-CM | POA: Diagnosis not present

## 2021-04-10 DIAGNOSIS — R809 Proteinuria, unspecified: Secondary | ICD-10-CM | POA: Diagnosis not present

## 2021-04-10 DIAGNOSIS — N1831 Chronic kidney disease, stage 3a: Secondary | ICD-10-CM | POA: Diagnosis not present

## 2021-04-11 ENCOUNTER — Other Ambulatory Visit: Payer: Self-pay

## 2021-04-11 MED ORDER — CLENPIQ 10-3.5-12 MG-GM -GM/160ML PO SOLN: 320.0000 mL | ORAL | 0 refills | Status: AC

## 2021-05-30 DIAGNOSIS — E079 Disorder of thyroid, unspecified: Secondary | ICD-10-CM | POA: Diagnosis not present

## 2021-06-10 IMAGING — CR DG CHEST 2V
2 series · 2 of 2 positions shown · non-contrast
Comparison: By report from 01/25/2020

CLINICAL DATA: Shortness of breath

EXAM:
CHEST - 2 VIEW

[chest pa]
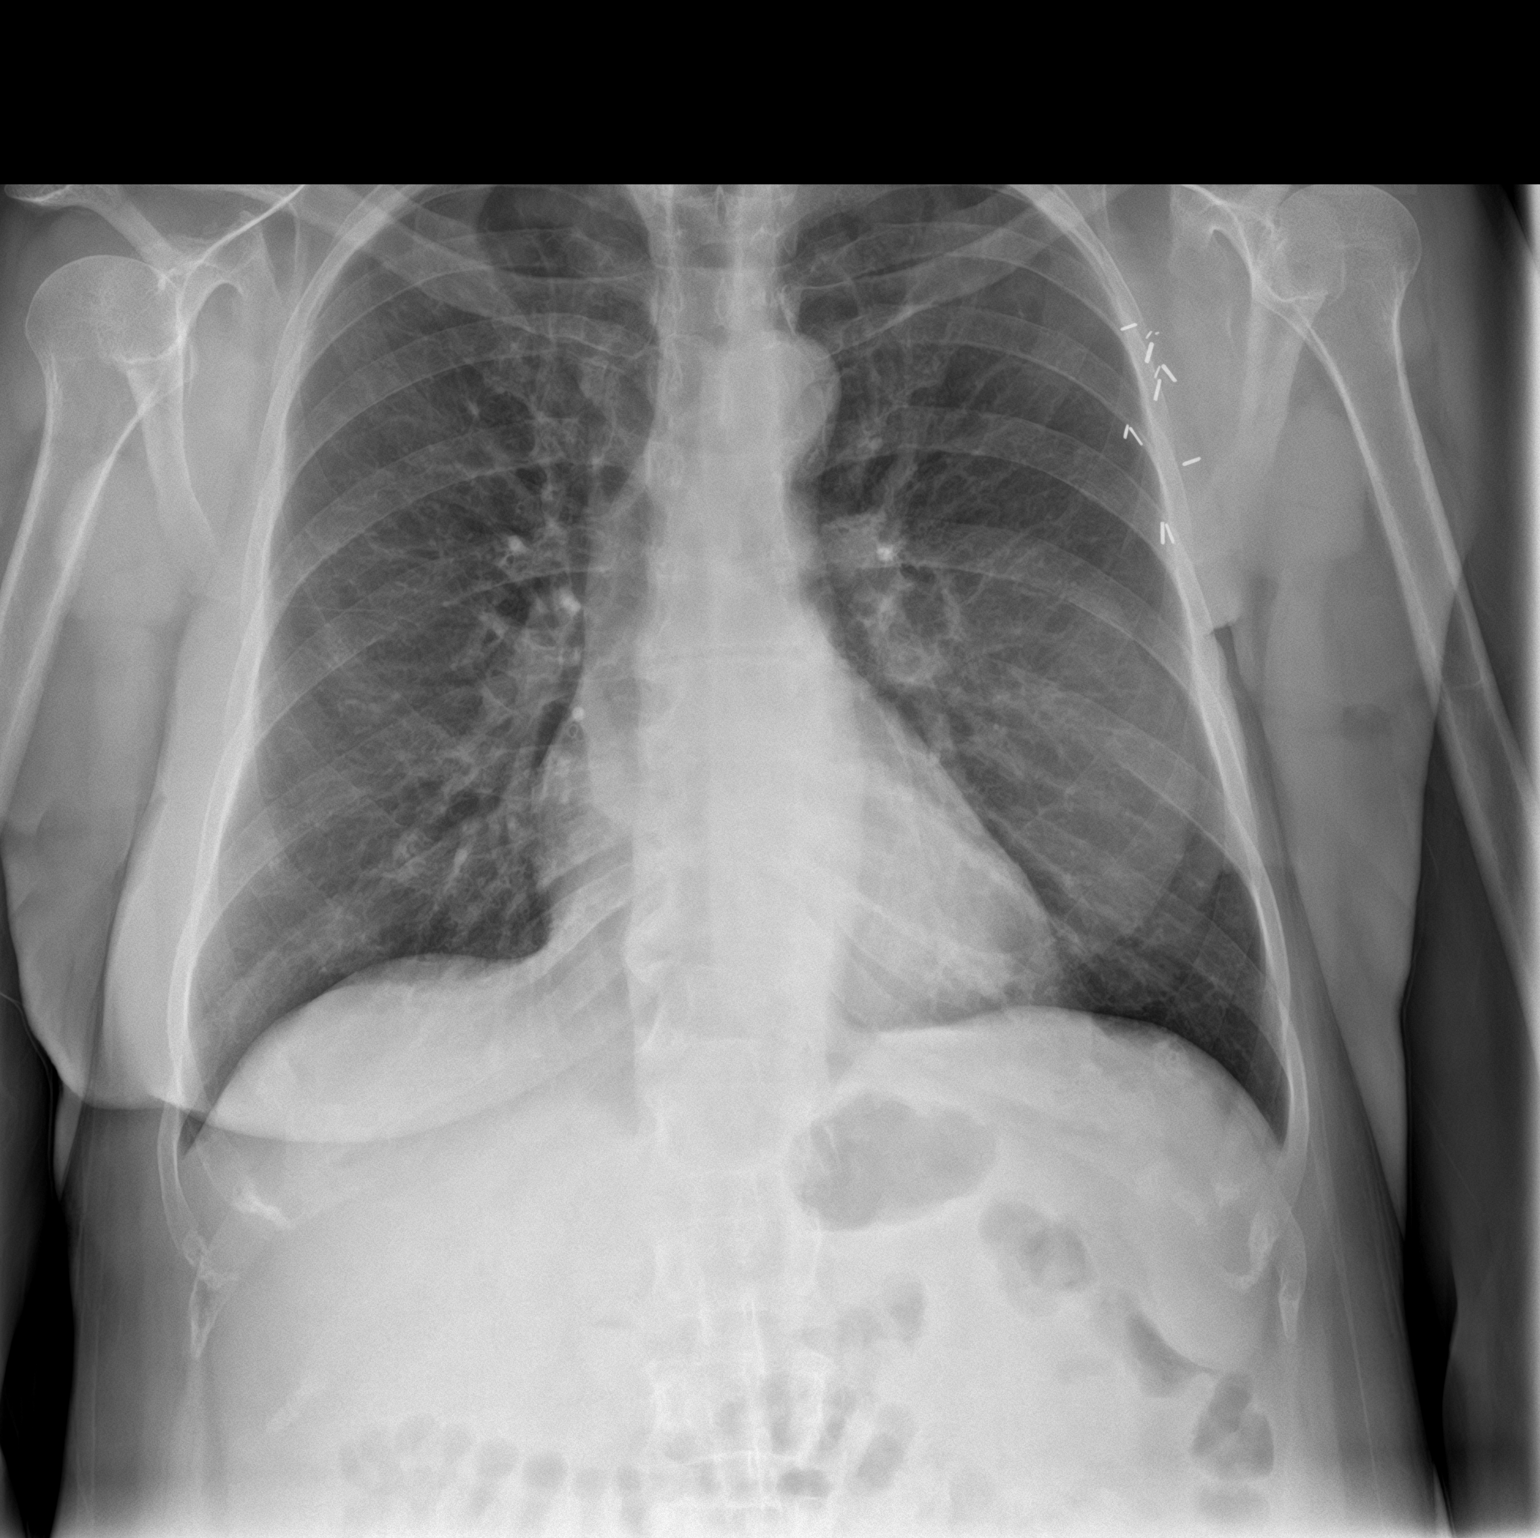

[chest lat]
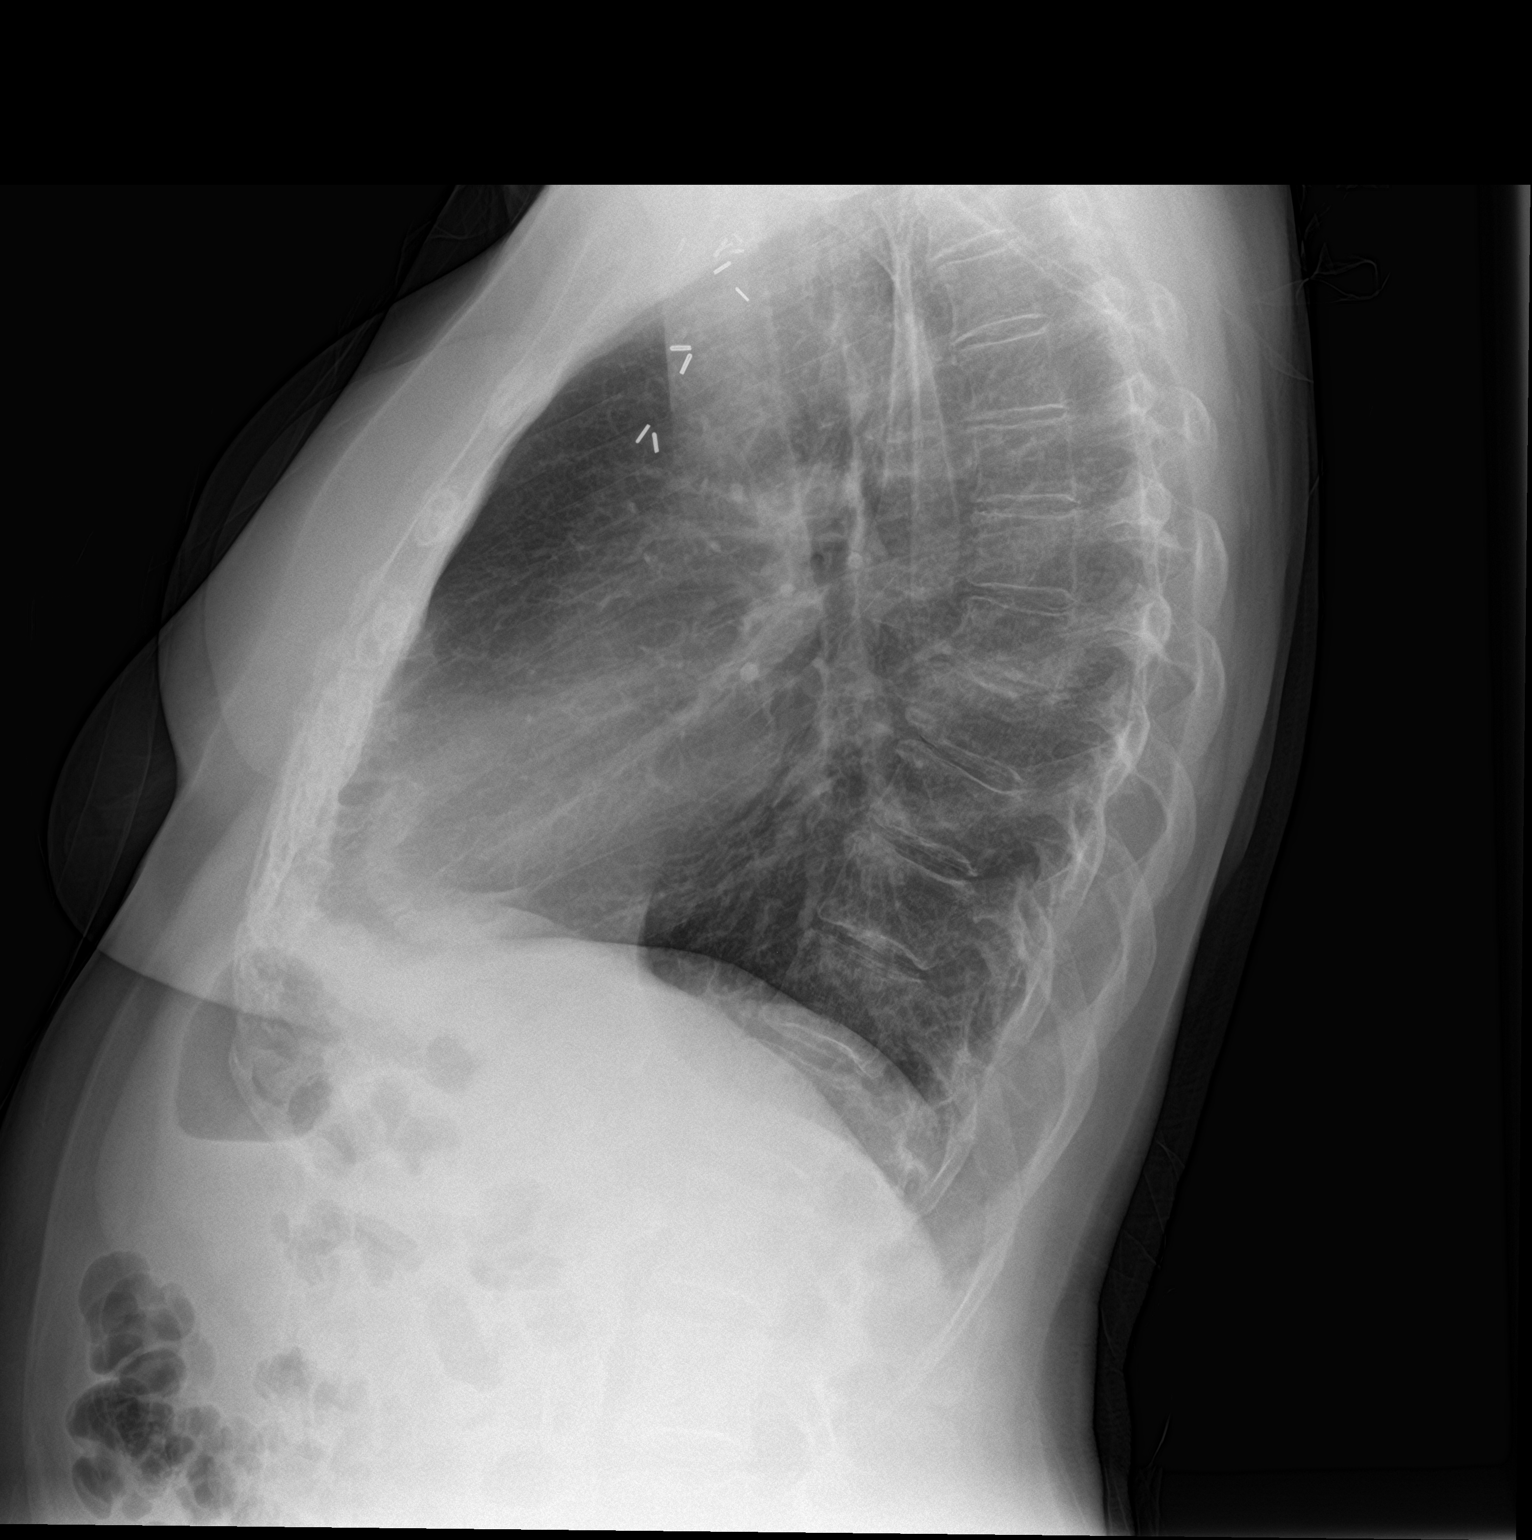

[2 of 2 positions shown; findings below may reference images not displayed]

FINDINGS: Cardiac shadow is within normal limits. Left breast implant is
noted. Left axillary node dissection is seen. The lungs are clear
bilaterally. No focal infiltrate or effusion is seen. No acute bony
abnormality is noted.
IMPRESSION: No acute abnormality noted.

## 2021-06-10 IMAGING — US US RENAL
1 series · 14 of 25 positions shown · non-contrast
Comparison: None.

CLINICAL DATA: Acute kidney injury

EXAM:
RENAL / URINARY TRACT ULTRASOUND COMPLETE

[Series 1: us renal · 0.23mm/px · 14 of 28 slices shown]
[im 1/28]
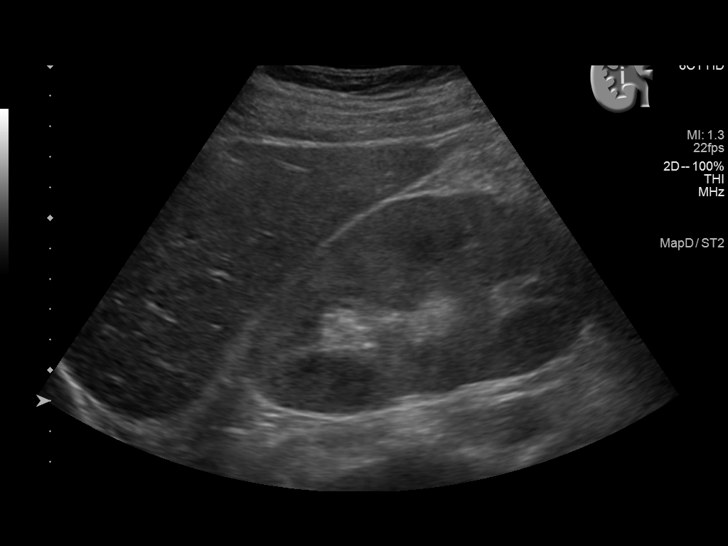
[im 3/28]
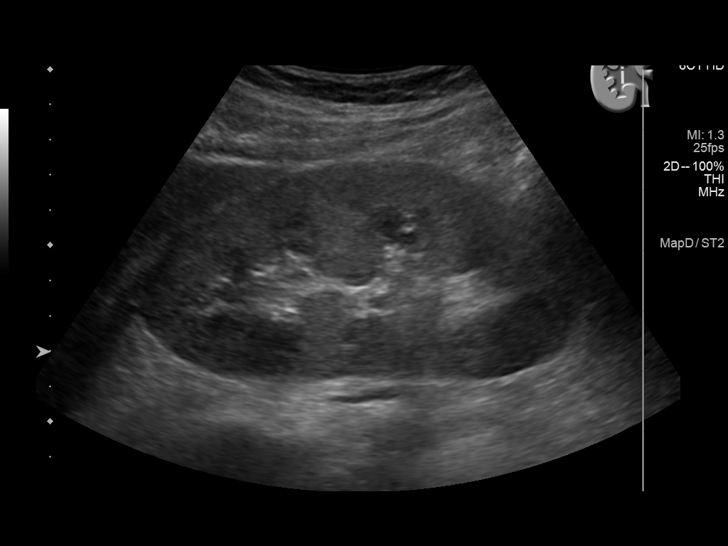
[im 5/28]
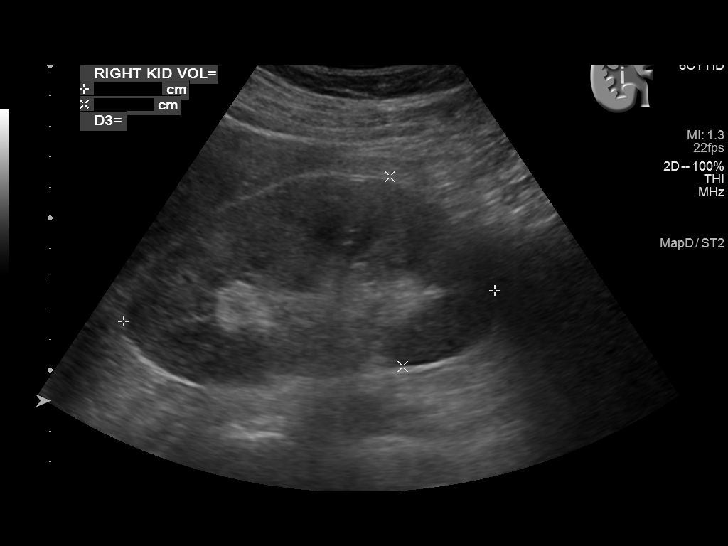
[im 7/28]
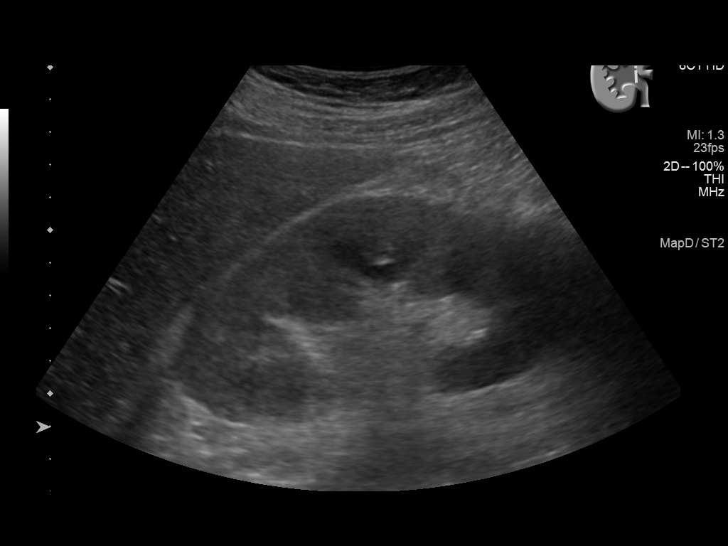
[im 10/28]
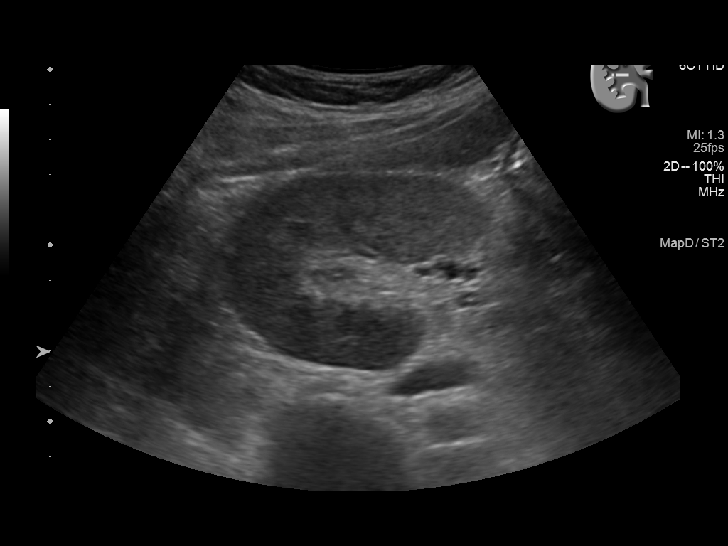
[im 11/28]
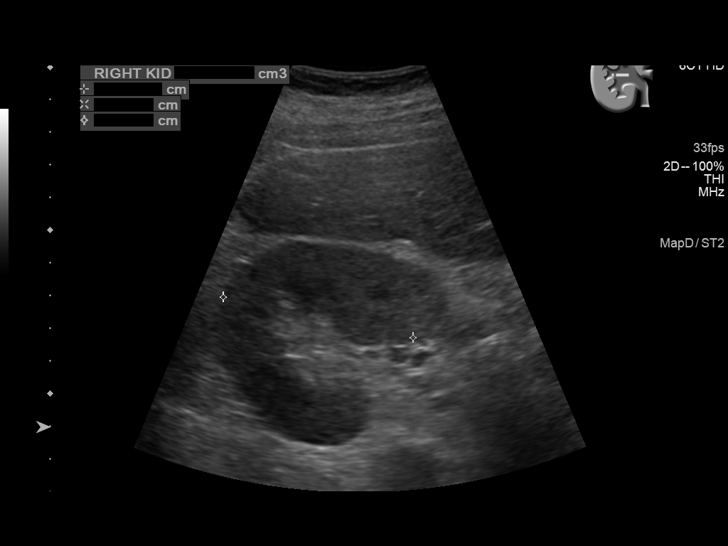
[im 13/28]
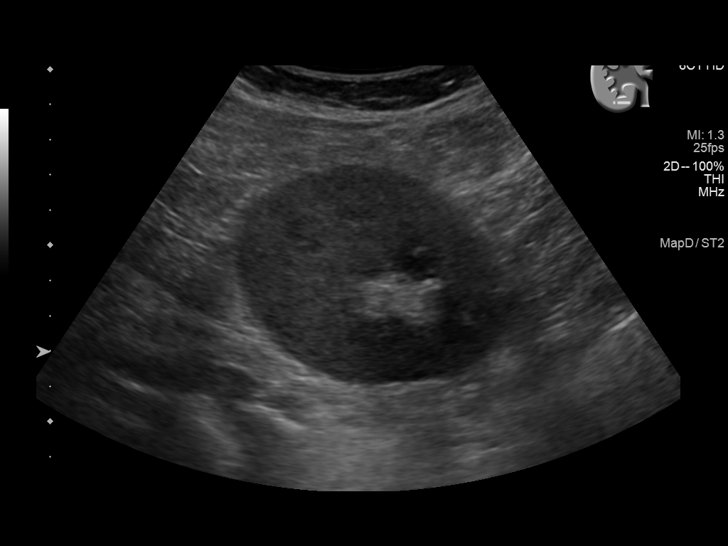
[im 15/28]
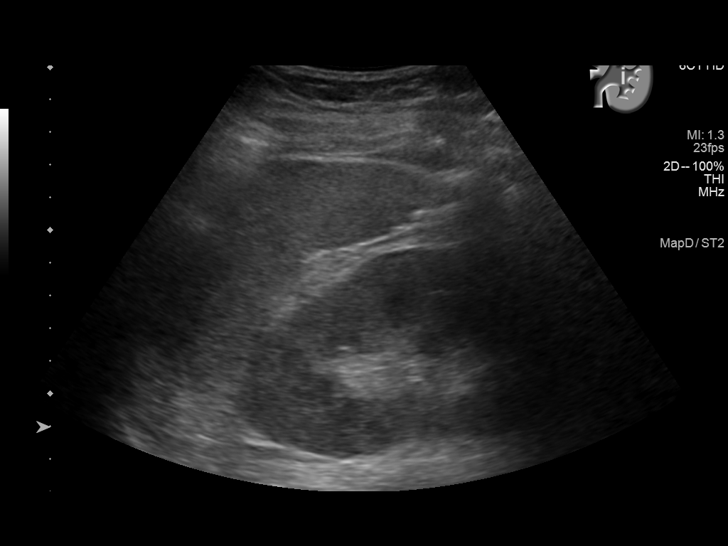
[im 17/28]
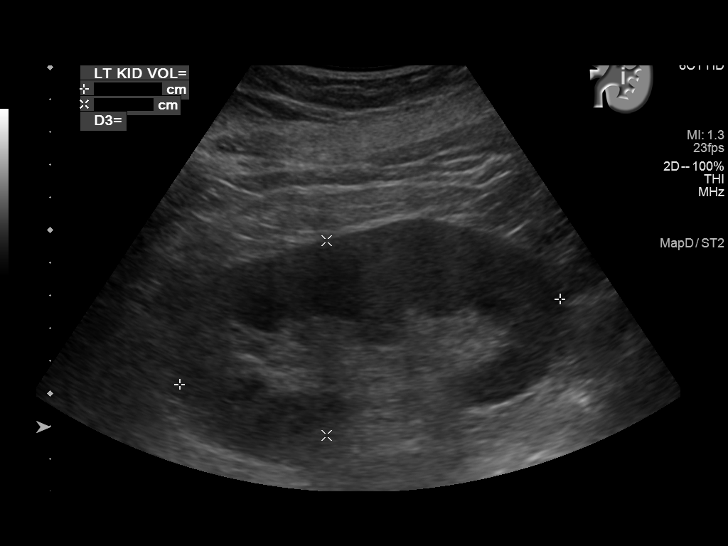
[im 19/28]
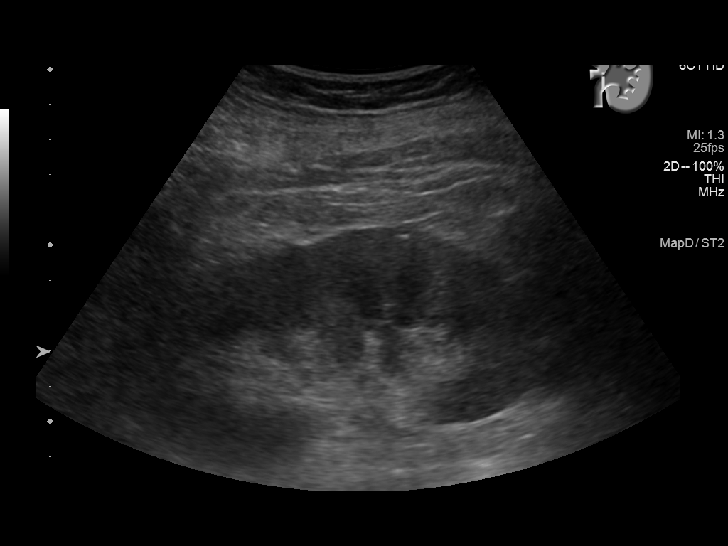
[im 21/28]
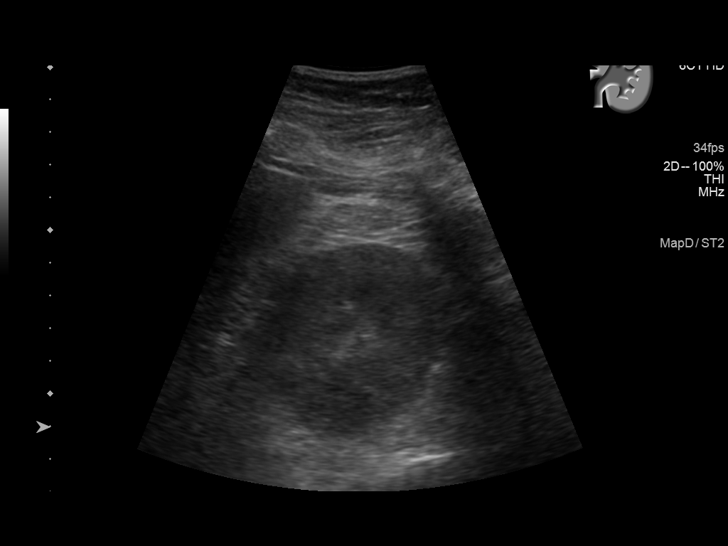
[im 23/28]
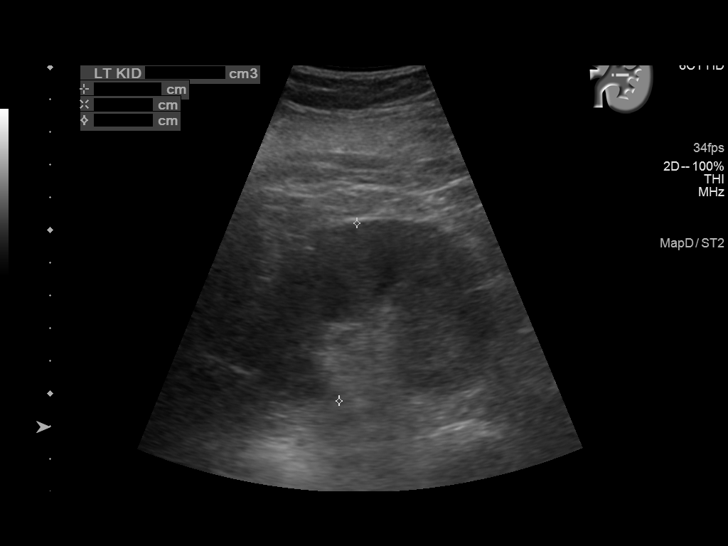
[im 25/28]
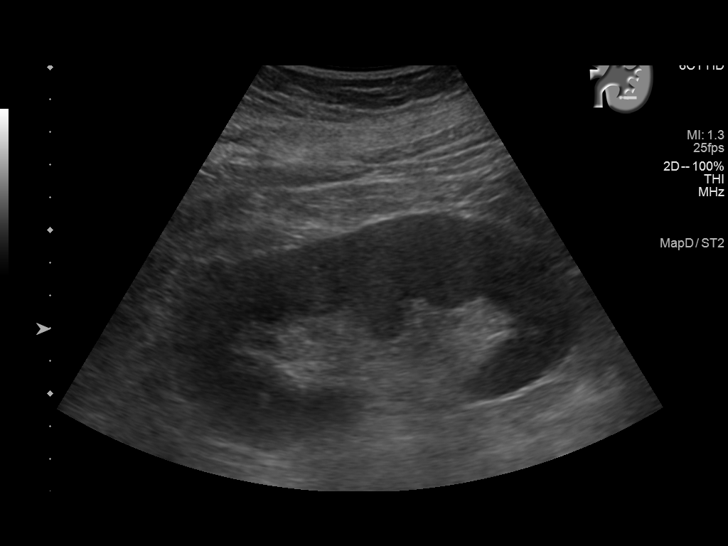
[im 28/28]
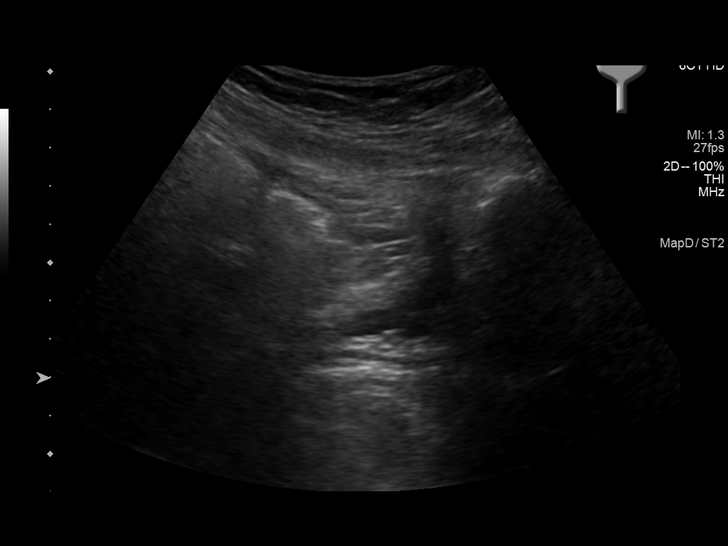

[14 of 25 positions shown; findings below may reference images not displayed]

FINDINGS: Right Kidney:

Renal measurements: 12.2 x 6.2 x 5.9 cm = volume: 237 mL. Diffusely
increased echotexture. No mass or hydronephrosis.

Left Kidney:

Renal measurements: 11.9 x 6.0 x 5.5 cm = volume: 203 mL. Diffusely
increased echotexture. No mass or hydronephrosis.

Bladder:

Appears normal for degree of bladder distention.

Other:

None.
IMPRESSION: No acute findings.  No hydronephrosis.

Mildly increased echotexture throughout the kidneys suggesting
chronic medical renal disease.

## 2021-06-15 IMAGING — RF DG ESOPHAGUS
12 of 18 series · 14 of 24 positions shown · non-contrast
Comparison: None.

CLINICAL DATA: Vomiting after meals

EXAM:
ESOPHOGRAM / BARIUM SWALLOW / BARIUM TABLET STUDY
TECHNIQUE: Combined double contrast and single contrast examination performed
using effervescent crystals, thick barium liquid, and thin barium
liquid. The patient was observed with fluoroscopy swallowing a 13 mm
barium sulphate tablet.
FLUOROSCOPY TIME:  Fluoroscopy Time:  1 minutes 12 seconds
Radiation Exposure Index (if provided by the fluoroscopic device):
21.5 mGy
Number of Acquired Spot Images: 22

[Series 1: fluoro_barium 2fps_bw · 0.19mm/px · 1 of 1 slices shown (1 of 6)]
[im 1/1]
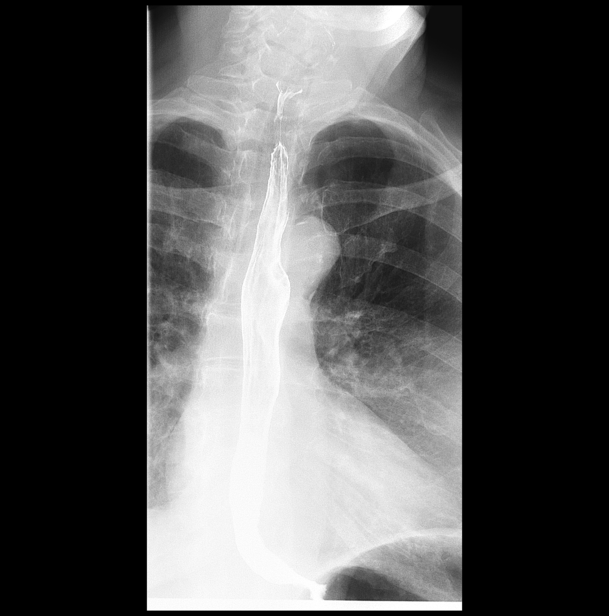

[Series 3: cp_standard · 0.28mm/px · 1 of 1 slices shown (1 of 6)]
[im 1/1]
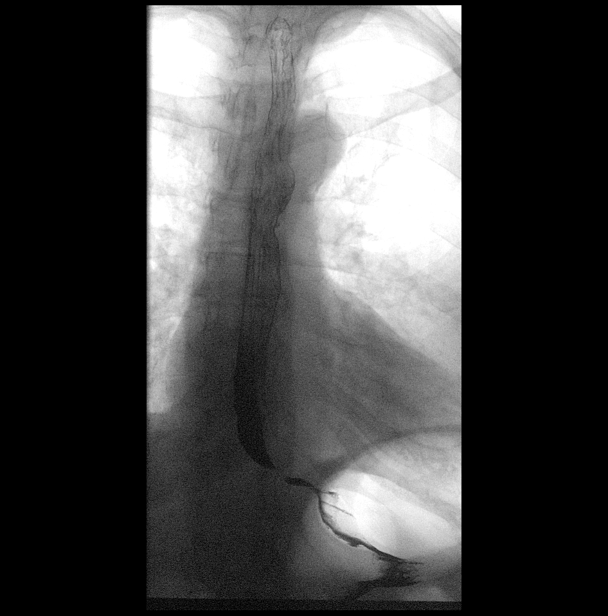

[Series 5: fluoro_barium 2fps_bw · 0.19mm/px · 1 of 1 slices shown (2 of 6)]
[im 1/1]
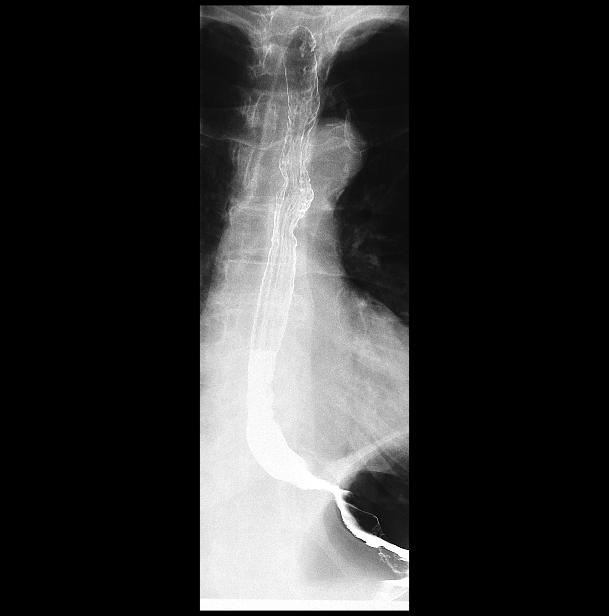

[Series 7: fluoro_barium 2fps_bw · 0.19mm/px · 1 of 1 slices shown (3 of 6)]
[im 1/1]
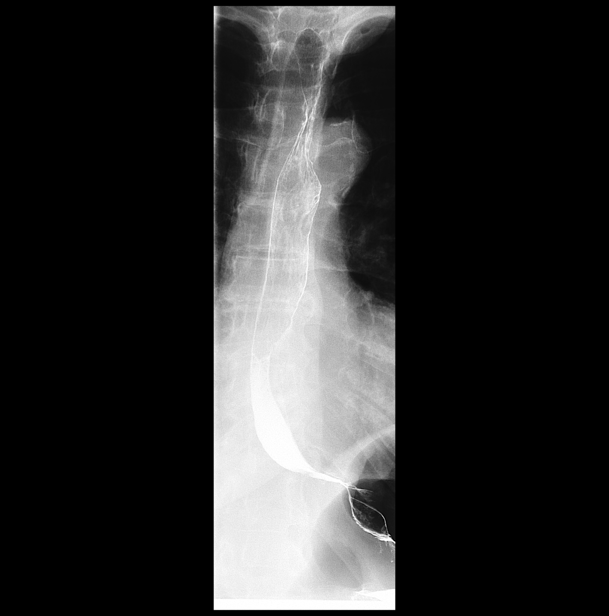

[Series 8: fluoro_barium 2fps_bw · 0.19mm/px · 1 of 1 slices shown (4 of 6)]
[im 1/1]
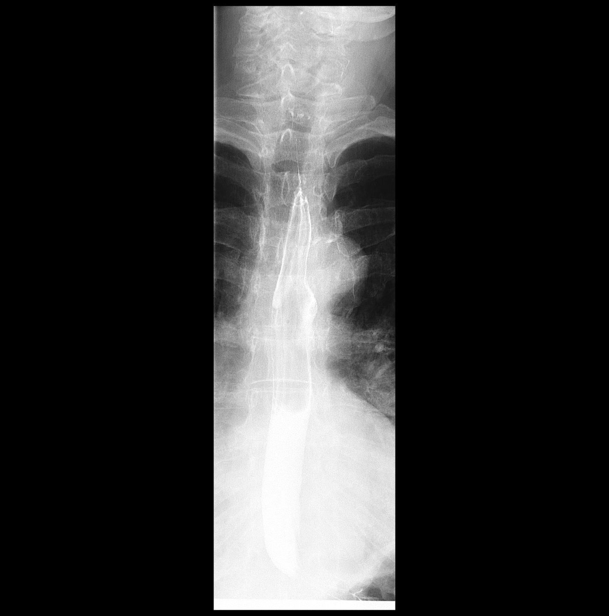

[Series 10: fluoro_barium 2fps_bw · 0.19mm/px · 1 of 1 slices shown (5 of 6)]
[im 1/1]
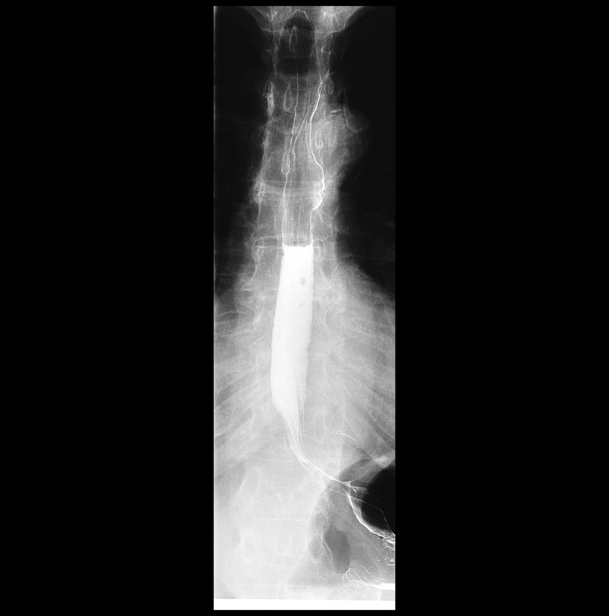

[Series 12: cp_standard · 0.19mm/px · 1 of 1 slices shown (2 of 6)]
[im 1/1]
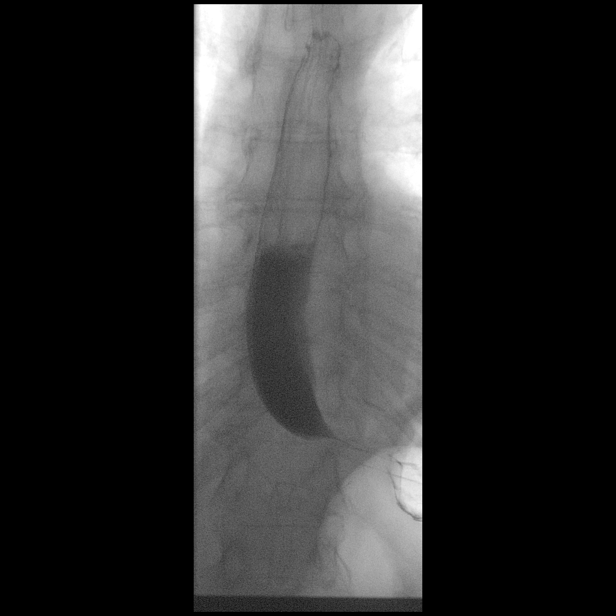

[Series 13: fluoro_barium 2fps_bw · 0.19mm/px · 1 of 1 slices shown (6 of 6)]
[im 1/1]
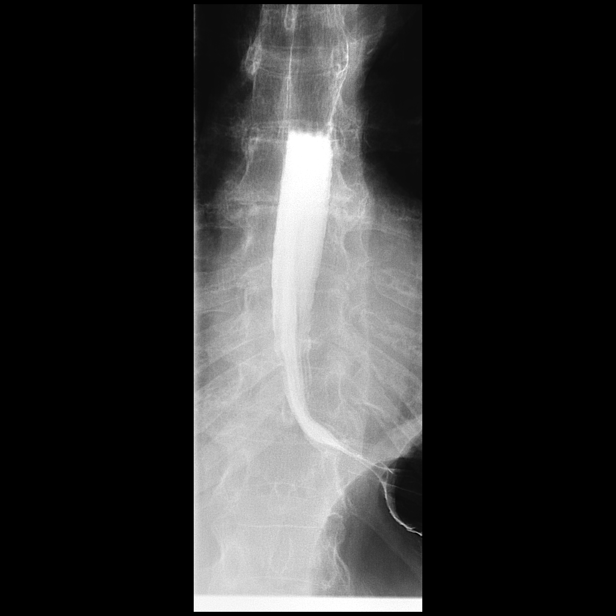

[Series 15: cp_standard · 0.19mm/px · 1 of 1 slices shown (3 of 6)]
[im 1/1]
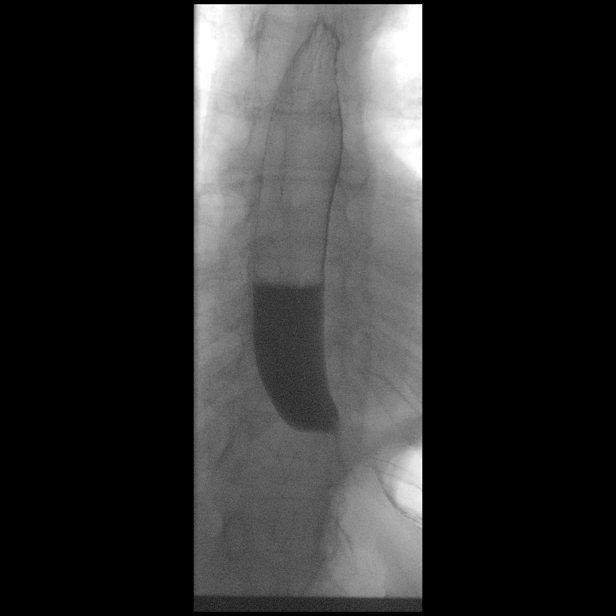

[Series 16: cp_standard · 0.19mm/px · 2 of 28 frames shown (4 of 6)]
[frame 15/28]
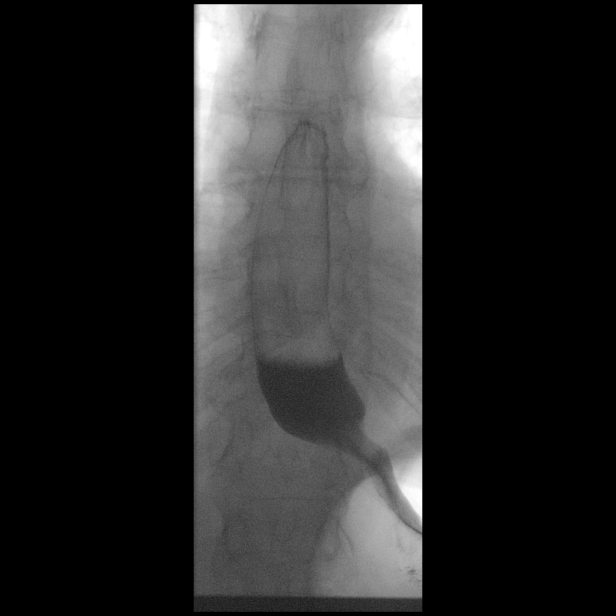
[frame 28/28]
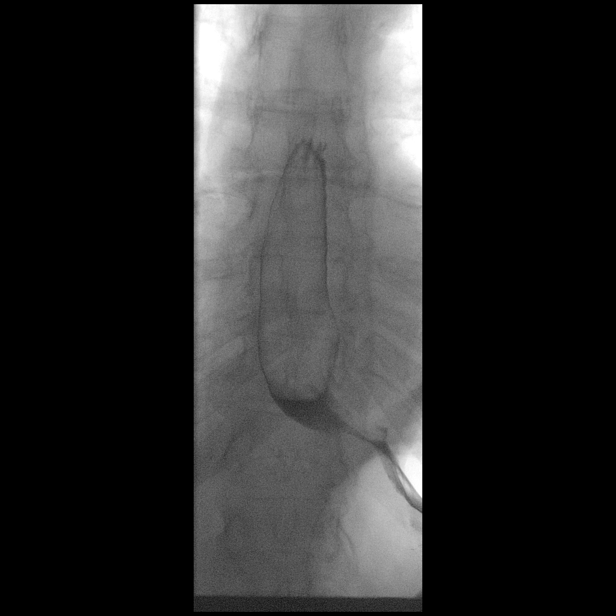

[Series 17: cp_standard · 0.19mm/px · 1 of 1 slices shown (5 of 6)]
[im 1/1]
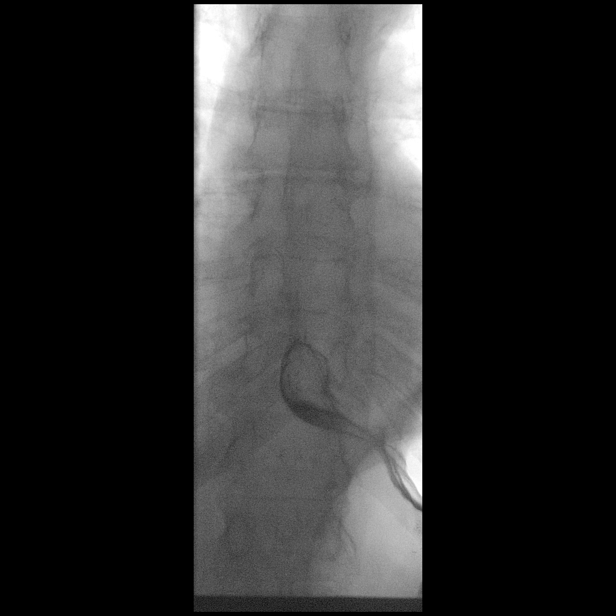

[Series 18: cp_standard · 0.19mm/px · 2 of 31 frames shown (6 of 6)]
[frame 16/31]
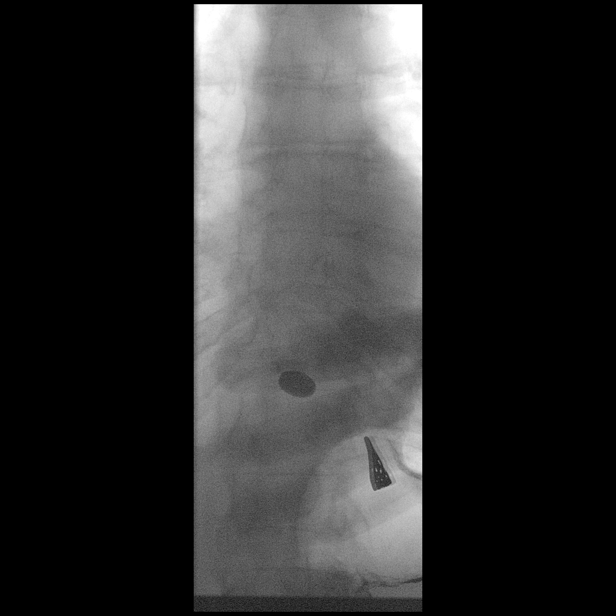
[frame 27/31]
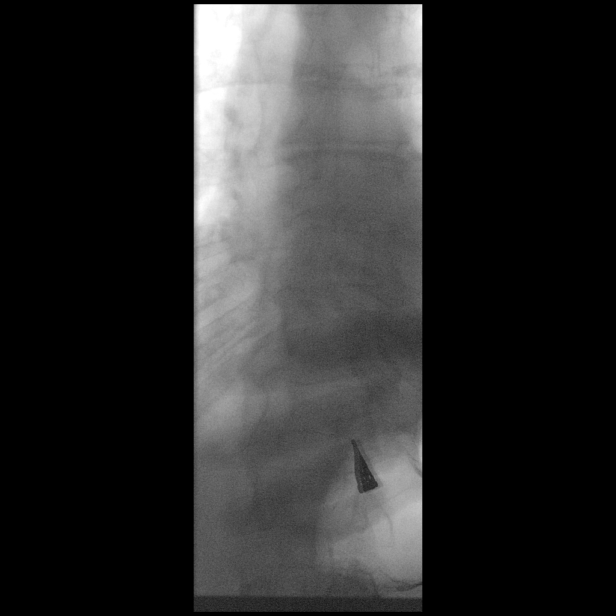

[14 of 24 positions shown; findings below may reference images not displayed]

FINDINGS: Decreased primary peristaltic wave with mild intermittent tertiary
contractions. There was contraction at the lower esophageal
sphincter with delay of the barium column within the mid and distal
esophagus. The distal esophagus did relax allowing the column to
pass into the stomach. No persisting stricture. No discrete mass or
ulceration. No spontaneous gastroesophageal reflux. No hiatal
hernia. At the conclusion of the study, a 13 mm barium tablet was
administered. This passed without delay into the stomach.
IMPRESSION: 1. Intermittent lower esophageal spasm without persisting stricture.
2. Mild esophageal dysmotility.

## 2021-06-19 ENCOUNTER — Telehealth: Payer: Self-pay | Admitting: Gastroenterology

## 2021-06-19 NOTE — Telephone Encounter (Signed)
LVM for pt to return my call.

## 2021-06-19 NOTE — Telephone Encounter (Signed)
Pt. Has questions about what meds to take or not take before her procedure. She also has questions about the prep. She is requesting as call back.

## 2021-06-20 NOTE — Telephone Encounter (Signed)
Pt returned my call and questions answered about her prep and instructions.

## 2021-06-21 ENCOUNTER — Telehealth: Payer: Self-pay

## 2021-06-21 ENCOUNTER — Other Ambulatory Visit: Payer: Self-pay

## 2021-06-21 MED ORDER — SUTAB 1479-225-188 MG PO TABS: 1.0000 | ORAL_TABLET | ORAL | Status: AC

## 2021-06-27 ENCOUNTER — Encounter
Admission: RE | Disposition: A | Discharge status: Home / Self Care | Payer: Self-pay | Source: Home / Self Care | Attending: Gastroenterology

## 2021-06-27 ENCOUNTER — Ambulatory Visit: Payer: PPO | Admitting: Anesthesiology

## 2021-06-27 ENCOUNTER — Ambulatory Visit
Admission: RE | Admit: 2021-06-27 | Discharge: 2021-06-27 | Disposition: A | Discharge status: Home / Self Care | Payer: PPO | Attending: Gastroenterology | Admitting: Gastroenterology

## 2021-06-27 ENCOUNTER — Encounter: Payer: Self-pay | Admitting: Gastroenterology

## 2021-06-27 ENCOUNTER — Other Ambulatory Visit: Payer: Self-pay

## 2021-06-27 DIAGNOSIS — K5730 Diverticulosis of large intestine without perforation or abscess without bleeding: Secondary | ICD-10-CM | POA: Diagnosis not present

## 2021-06-27 DIAGNOSIS — Z860100 Personal history of colon polyps, unspecified: Secondary | ICD-10-CM

## 2021-06-27 DIAGNOSIS — Z79899 Other long term (current) drug therapy: Secondary | ICD-10-CM | POA: Insufficient documentation

## 2021-06-27 DIAGNOSIS — Z79890 Hormone replacement therapy (postmenopausal): Secondary | ICD-10-CM | POA: Insufficient documentation

## 2021-06-27 DIAGNOSIS — Z992 Dependence on renal dialysis: Secondary | ICD-10-CM | POA: Insufficient documentation

## 2021-06-27 DIAGNOSIS — Z87891 Personal history of nicotine dependence: Secondary | ICD-10-CM | POA: Insufficient documentation

## 2021-06-27 DIAGNOSIS — E039 Hypothyroidism, unspecified: Secondary | ICD-10-CM | POA: Insufficient documentation

## 2021-06-27 DIAGNOSIS — N1831 Chronic kidney disease, stage 3a: Secondary | ICD-10-CM | POA: Diagnosis not present

## 2021-06-27 DIAGNOSIS — Z1211 Encounter for screening for malignant neoplasm of colon: Secondary | ICD-10-CM | POA: Insufficient documentation

## 2021-06-27 DIAGNOSIS — Z7982 Long term (current) use of aspirin: Secondary | ICD-10-CM | POA: Insufficient documentation

## 2021-06-27 DIAGNOSIS — I10 Essential (primary) hypertension: Secondary | ICD-10-CM | POA: Insufficient documentation

## 2021-06-27 DIAGNOSIS — I129 Hypertensive chronic kidney disease with stage 1 through stage 4 chronic kidney disease, or unspecified chronic kidney disease: Secondary | ICD-10-CM | POA: Diagnosis not present

## 2021-06-27 DIAGNOSIS — K635 Polyp of colon: Secondary | ICD-10-CM | POA: Diagnosis not present

## 2021-06-27 DIAGNOSIS — K648 Other hemorrhoids: Secondary | ICD-10-CM | POA: Diagnosis not present

## 2021-06-27 DIAGNOSIS — Z9221 Personal history of antineoplastic chemotherapy: Secondary | ICD-10-CM | POA: Diagnosis not present

## 2021-06-27 DIAGNOSIS — K219 Gastro-esophageal reflux disease without esophagitis: Secondary | ICD-10-CM | POA: Insufficient documentation

## 2021-06-27 DIAGNOSIS — D126 Benign neoplasm of colon, unspecified: Secondary | ICD-10-CM | POA: Diagnosis not present

## 2021-06-27 DIAGNOSIS — E785 Hyperlipidemia, unspecified: Secondary | ICD-10-CM | POA: Insufficient documentation

## 2021-06-27 HISTORY — PX: COLONOSCOPY: SHX5424

## 2021-06-27 SURGERY — COLONOSCOPY
Anesthesia: General

## 2021-06-27 MED ORDER — PROPOFOL 10 MG/ML IV BOLUS
INTRAVENOUS | Status: DC | PRN
  Administered 2021-06-27: 20 mg via INTRAVENOUS
  Administered 2021-06-27: 50 mg via INTRAVENOUS

## 2021-06-27 MED ORDER — SODIUM CHLORIDE 0.9 % IV SOLN: INTRAVENOUS | Status: DC

## 2021-06-27 MED ORDER — PROPOFOL 500 MG/50ML IV EMUL
INTRAVENOUS | Status: DC | PRN
  Administered 2021-06-27: 200 ug/kg/min via INTRAVENOUS

## 2021-06-27 NOTE — Anesthesia Preprocedure Evaluation (Signed)
Anesthesia Evaluation  Patient identified by MRN, date of birth, ID band Patient awake    Reviewed: Allergy & Precautions, H&P , NPO status , Patient's Chart, lab work & pertinent test results, reviewed documented beta blocker date and time   History of Anesthesia Complications Negative for: history of anesthetic complications  Airway Mallampati: II   Neck ROM: full    Dental  (+) Poor Dentition, Dental Advidsory Given   Pulmonary neg pulmonary ROS, former smoker,    Pulmonary exam normal        Cardiovascular Exercise Tolerance: Good hypertension, On Medications and Pt. on medications (-) angina(-) Past MI and (-) Cardiac Stents Normal cardiovascular exam(-) dysrhythmias (-) Valvular Problems/Murmurs Rhythm:regular Rate:Normal     Neuro/Psych PSYCHIATRIC DISORDERS Depression negative neurological ROS  negative psych ROS   GI/Hepatic Neg liver ROS, Bowel prep,GERD  Medicated,  Endo/Other  negative endocrine ROSneg diabetesHypothyroidism   Renal/GU ARFRenal disease (from dehydration)  negative genitourinary   Musculoskeletal   Abdominal   Peds  Hematology negative hematology ROS (+)   Anesthesia Other Findings Acute kidney failure ([place]) 02/10/2020  Benign essential hypertension 02/10/2020  Depressive disorder  Gastroesophageal reflux disease  Hematuria 02/10/2020  Hyperlipidemia  Hypokalemia 02/10/2020  Hypothyroidism  Malignant tumor of breast ([place])  Metabolic acidosis 57/97/2820  Proteinuria 02/10/2020  Rheumatic fever  Seasonal allergic rhinitis  Stage 3a chronic kidney disease ([place]) 03/31/2020  Ulcer of larynx    Reproductive/Obstetrics negative OB ROS                             Anesthesia Physical  Anesthesia Plan  ASA: 3  Anesthesia Plan: General   Post-op Pain Management:    Induction: Intravenous  PONV Risk Score and Plan: 3 and Propofol infusion and  TIVA  Airway Management Planned: Nasal Cannula and Natural Airway  Additional Equipment:   Intra-op Plan:   Post-operative Plan:   Informed Consent: I have reviewed the patients History and Physical, chart, labs and discussed the procedure including the risks, benefits and alternatives for the proposed anesthesia with the patient or authorized representative who has indicated his/her understanding and acceptance.     Dental Advisory Given  Plan Discussed with: CRNA, Anesthesiologist and Surgeon  Anesthesia Plan Comments:         Anesthesia Quick Evaluation

## 2021-06-27 NOTE — H&P (Signed)
Lucilla Lame, MD [address] [place], [place] [address] Phone:[phone] Fax : [phone]  Primary Care Physician:  Maryland Pink, MD Primary Gastroenterologist:  Dr. Allen Norris  Pre-Procedure History & Physical: HPI:  Michelle Lambert is a 75 y.o. female is here for an colonoscopy.   Past Medical History:  Diagnosis Date   Allergy    seasonal   Cancer [facility])    left/chemo 1989   Depression    Hyperlipidemia    Hypertension    Hypothyroidism    Rheumatic fever    Thyroid disease     Past Surgical History:  Procedure Laterality Date   BREAST BIOPSY Right 06/13/2006   benign   BREAST EXCISIONAL BIOPSY Right 1982   benign   BREAST IMPLANT REMOVAL     left with replacement implant 2011   BREAST RECONSTRUCTION     silicone1989 left   COLONOSCOPY     COLONOSCOPY WITH PROPOFOL N/A 06/06/2018   Procedure: COLONOSCOPY WITH PROPOFOL;  Surgeon: Lollie Sails, MD;  Location: [facility] ENDOSCOPY;  Service: Endoscopy;  Laterality: N/A;   DIALYSIS/PERMA CATHETER INSERTION N/A 01/27/2020   Procedure: DIALYSIS/PERMA CATHETER INSERTION;  Surgeon: Katha Cabal, MD;  Location: [facility] CV LAB;  Service: Cardiovascular;  Laterality: N/A;   ESOPHAGOGASTRODUODENOSCOPY (EGD) WITH PROPOFOL N/A 02/11/2020   Procedure: ESOPHAGOGASTRODUODENOSCOPY (EGD) WITH PROPOFOL;  Surgeon: Lucilla Lame, MD;  Location: [facility] ENDOSCOPY;  Service: Endoscopy;  Laterality: N/A;   MASTECTOMY Left    left 1989   POLYPECTOMY      Prior to Admission medications   Medication Sig Start Date End Date Taking? Authorizing Provider  amLODipine (NORVASC) 5 MG tablet Take 1 tablet (5 mg total) by mouth daily. 02/02/20  Yes Danford, Suann Larry, MD  Ascorbic Acid (VITAMIN C) 1000 MG tablet Take 1,000 mg by mouth daily.     Yes [provider]  aspirin 81 MG EC tablet Take 81 mg by mouth daily.     Yes [provider]  b complex vitamins tablet Take 1 tablet by mouth daily.   Yes  [provider]  calcium-vitamin D (OSCAL-500) 500-400 MG-UNIT per tablet Take 1 tablet by mouth daily.     Yes [provider]  CANDESARTAN CILEXETIL PO Take 8 mg by mouth daily.   Yes [provider]  levothyroxine (SYNTHROID) 88 MCG tablet Take 88 mcg by mouth daily.    Yes [provider]  montelukast (SINGULAIR) 10 MG tablet Take 10 mg by mouth daily. 01/25/20  Yes [provider]  Multiple Vitamin (MULTI-VITAMIN) tablet Take by mouth.   Yes [provider]  omega-3 acid ethyl esters (LOVAZA) 1 g capsule Take 1 g by mouth 2 (two) times daily.   Yes [provider]  rosuvastatin (CRESTOR) 20 MG tablet Take 20 mg by mouth daily.     Yes [provider]  vitamin E 400 UNIT capsule Take 400 Units by mouth daily.     Yes [provider]  pantoprazole (PROTONIX) 40 MG tablet Take 1 tablet (40 mg total) by mouth daily. Patient not taking: Reported on 06/27/2021 02/02/20   Edwin Dada, MD  potassium chloride (MICRO-K) 10 MEQ CR capsule Take 2 capsules (20 mEq total) by mouth 2 (two) times daily. Patient not taking: Reported on 06/27/2021 02/02/20   Edwin Dada, MD  promethazine (PHENERGAN) 25 MG tablet Take 1 tablet (25 mg total) by mouth every 6 (six) hours as needed for nausea. Patient not  taking: No sig reported 02/02/20   Danford, Suann Larry, MD  Sod Picosulfate-Mag Ox-Cit Acd (CLENPIQ) 10-3.5-12 MG-GM -GM/160ML SOLN Take 320 mLs by mouth as directed. Patient not taking: Reported on 06/27/2021 04/11/21   Lucilla Lame, MD  Sodium Sulfate-Mag Sulfate-KCl (SUTAB) [phone] MG TABS Take 1 kit by mouth as directed. Patient not taking: Reported on 06/27/2021 06/21/21   Lucilla Lame, MD    Allergies as of 04/11/2021   (No Known Allergies)    Family History  Problem Relation Age of Onset   Heart disease Mother    Emphysema Father     Social History   Socioeconomic History   Marital  status: Married    Spouse name: Not on file   Number of children: Not on file   Years of education: Not on file   Highest education level: Not on file  Occupational History   Not on file  Tobacco Use   Smoking status: Former    Types: Cigarettes    Quit date: 2002    Years since quitting: 20.7   Smokeless tobacco: Never  Vaping Use   Vaping Use: Never used  Substance and Sexual Activity   Alcohol use: No   Drug use: No   Sexual activity: Not on file  Other Topics Concern   Not on file  Social History Narrative   Not on file   Social Determinants of Health   Financial Resource Strain: Not on file  Food Insecurity: Not on file  Transportation Needs: Not on file  Physical Activity: Not on file  Stress: Not on file  Social Connections: Not on file  Intimate Partner Violence: Not on file    Review of Systems: See HPI, otherwise negative ROS  Physical Exam: BP (!) 156/79   Pulse 62   Temp (!) 96.3 F (35.7 C) (Temporal)   Resp 16   Ht 5' 3"  (1.6 m)   Wt 68 kg   SpO2 99%   BMI 26.57 kg/m  General:   Alert,  pleasant and cooperative in NAD Head:  Normocephalic and atraumatic. Neck:  Supple; no masses or thyromegaly. Lungs:  Clear throughout to auscultation.    Heart:  Regular rate and rhythm. Abdomen:  Soft, nontender and nondistended. Normal bowel sounds, without guarding, and without rebound.   Neurologic:  Alert and  oriented x4;  grossly normal neurologically.  Impression/Plan: Michelle Lambert is here for an colonoscopy to be performed for a history of adenomatous polyps on 2019   Risks, benefits, limitations, and alternatives regarding  colonoscopy have been reviewed with the patient.  Questions have been answered.  All parties agreeable.   Lucilla Lame, MD  06/27/2021, 8:56 AM

## 2021-06-27 NOTE — Anesthesia Postprocedure Evaluation (Signed)
Anesthesia Post Note  Patient: Michelle Lambert  Procedure(s) Performed: COLONOSCOPY  Patient location during evaluation: Phase II Anesthesia Type: General Level of consciousness: awake and alert, awake and oriented Pain management: pain level controlled Vital Signs Assessment: post-procedure vital signs reviewed and stable Respiratory status: spontaneous breathing, nonlabored ventilation and respiratory function stable Cardiovascular status: blood pressure returned to baseline and stable Postop Assessment: no apparent nausea or vomiting Anesthetic complications: no   No notable events documented.   Last Vitals:  Vitals:   06/27/21 0937 06/27/21 0947  BP: (!) 89/64 (!) 149/76  Pulse: 67 61  Resp: 18 16  Temp:    SpO2: 98% 100%    Last Pain:  Vitals:   06/27/21 0947  TempSrc:   PainSc: 0-No pain                 Phill Mutter

## 2021-06-27 NOTE — Transfer of Care (Signed)
Immediate Anesthesia Transfer of Care Note  Patient: Michelle Lambert  Procedure(s) Performed: COLONOSCOPY  Patient Location: PACU  Anesthesia Type:General  Level of Consciousness: sedated  Airway & Oxygen Therapy: Patient Spontanous Breathing and Patient connected to nasal cannula oxygen  Post-op Assessment: Report given to RN and Post -op Vital signs reviewed and stable  Post vital signs: Reviewed and stable  Last Vitals:  Vitals Value Taken Time  BP 90/40 06/27/21 0927  Temp    Pulse 58 06/27/21 0928  Resp 16 06/27/21 0928  SpO2 100 % 06/27/21 0928  Vitals shown include unvalidated device data.  Last Pain:  Vitals:   06/27/21 0927  TempSrc:   PainSc: 0-No pain         Complications: No notable events documented.

## 2021-06-27 NOTE — Op Note (Signed)
[facility]astroenterology Patient Name: Michelle Lambert Procedure Date: 06/27/2021 9:09 AM MRN: [id number] Account #: [id number] Date of Birth: [birth year] Admit Type: Outpatient Age: 75 Room: [facility] ENDO ROOM 4 Gender: Female Note Status: Finalized Instrument Name: Jasper Riling [id number] Procedure:             Colonoscopy Indications:           High risk colon cancer surveillance: Personal history                         of colonic polyps Providers:             Lucilla Lame MD, MD Medicines:             Propofol per Anesthesia Complications:         No immediate complications. Procedure:             Pre-Anesthesia Assessment:                        - Prior to the procedure, a History and Physical was                         performed, and patient medications and allergies were                         reviewed. The patient's tolerance of previous                         anesthesia was also reviewed. The risks and benefits                         of the procedure and the sedation options and risks                         were discussed with the patient. All questions were                         answered, and informed consent was obtained. Prior                         Anticoagulants: The patient has taken no previous                         anticoagulant or antiplatelet agents. ASA Grade                         Assessment: II - A patient with mild systemic disease.                         After reviewing the risks and benefits, the patient                         was deemed in satisfactory condition to undergo the                         procedure.                        After obtaining informed consent, the colonoscope was  passed under direct vision. Throughout the procedure,                         the patient's blood pressure, pulse, and oxygen                         saturations were monitored continuously. The                          Colonoscope was introduced through the anus and                         advanced to the the cecum, identified by appendiceal                         orifice and ileocecal valve. The colonoscopy was                         performed without difficulty. The patient tolerated                         the procedure well. The quality of the bowel                         preparation was excellent. Findings:      The perianal and digital rectal examinations were normal.      A 3 mm polyp was found in the descending colon. The polyp was sessile.       The polyp was removed with a cold biopsy forceps. Resection and       retrieval were complete.      Multiple small-mouthed diverticula were found in the sigmoid colon.      Non-bleeding internal hemorrhoids were found during retroflexion. The       hemorrhoids were Grade I (internal hemorrhoids that do not prolapse). Impression:            - One 3 mm polyp in the descending colon, removed with                         a cold biopsy forceps. Resected and retrieved.                        - Diverticulosis in the sigmoid colon.                        - Non-bleeding internal hemorrhoids. Recommendation:        - Discharge patient to home.                        - Resume previous diet.                        - Continue present medications.                        - Await pathology results.                        - Repeat colonoscopy is not recommended for  surveillance. Procedure Code(s):     --- Professional ---                        [phone], Colonoscopy, flexible; with biopsy, single or                         multiple Diagnosis Code(s):     --- Professional ---                        Z86.010, Personal history of colonic polyps                        K63.5, Polyp of colon CPT copyright 2019 [employer]. All rights reserved. The codes documented in this report are preliminary and upon coder review may  be revised  to meet current compliance requirements. Lucilla Lame MD, MD 06/27/2021 9:25:12 AM This report has been signed electronically. Number of Addenda: 0 Note Initiated On: 06/27/2021 9:09 AM Scope Withdrawal Time: 0 hours 7 minutes 22 seconds  Total Procedure Duration: 0 hours 11 minutes 47 seconds  Estimated Blood Loss:  Estimated blood loss: none.      [facility]

## 2021-06-28 LAB — SURGICAL PATHOLOGY

## 2021-06-29 ENCOUNTER — Encounter: Payer: Self-pay | Admitting: Gastroenterology

## 2021-07-03 ENCOUNTER — Encounter: Payer: Self-pay | Admitting: Gastroenterology

## 2021-07-10 DIAGNOSIS — E079 Disorder of thyroid, unspecified: Secondary | ICD-10-CM | POA: Diagnosis not present

## 2021-09-19 DIAGNOSIS — E079 Disorder of thyroid, unspecified: Secondary | ICD-10-CM | POA: Diagnosis not present

## 2021-10-03 DIAGNOSIS — E079 Disorder of thyroid, unspecified: Secondary | ICD-10-CM | POA: Diagnosis not present

## 2021-10-03 DIAGNOSIS — I10 Essential (primary) hypertension: Secondary | ICD-10-CM | POA: Diagnosis not present

## 2021-10-03 DIAGNOSIS — I779 Disorder of arteries and arterioles, unspecified: Secondary | ICD-10-CM | POA: Diagnosis not present

## 2021-10-03 DIAGNOSIS — F3342 Major depressive disorder, recurrent, in full remission: Secondary | ICD-10-CM | POA: Diagnosis not present

## 2021-10-03 DIAGNOSIS — M25572 Pain in left ankle and joints of left foot: Secondary | ICD-10-CM | POA: Diagnosis not present

## 2021-10-03 DIAGNOSIS — R053 Chronic cough: Secondary | ICD-10-CM | POA: Diagnosis not present

## 2021-10-03 DIAGNOSIS — N1831 Chronic kidney disease, stage 3a: Secondary | ICD-10-CM | POA: Diagnosis not present

## 2021-10-03 DIAGNOSIS — E785 Hyperlipidemia, unspecified: Secondary | ICD-10-CM | POA: Diagnosis not present

## 2021-10-04 ENCOUNTER — Other Ambulatory Visit: Payer: Self-pay | Admitting: Family Medicine

## 2021-10-04 DIAGNOSIS — I779 Disorder of arteries and arterioles, unspecified: Secondary | ICD-10-CM

## 2021-10-13 ENCOUNTER — Other Ambulatory Visit: Payer: Self-pay

## 2021-10-13 ENCOUNTER — Ambulatory Visit
Admission: RE | Admit: 2021-10-13 | Discharge: 2021-10-13 | Disposition: A | Discharge status: Home / Self Care | Payer: PPO | Source: Ambulatory Visit | Attending: Family Medicine | Admitting: Family Medicine

## 2021-10-13 DIAGNOSIS — I6523 Occlusion and stenosis of bilateral carotid arteries: Secondary | ICD-10-CM | POA: Diagnosis not present

## 2021-10-13 DIAGNOSIS — I779 Disorder of arteries and arterioles, unspecified: Secondary | ICD-10-CM | POA: Diagnosis not present

## 2021-10-27 DIAGNOSIS — R053 Chronic cough: Secondary | ICD-10-CM | POA: Diagnosis not present

## 2021-11-17 DIAGNOSIS — E079 Disorder of thyroid, unspecified: Secondary | ICD-10-CM | POA: Diagnosis not present

## 2022-01-09 DIAGNOSIS — Z1231 Encounter for screening mammogram for malignant neoplasm of breast: Secondary | ICD-10-CM | POA: Diagnosis not present

## 2022-01-09 DIAGNOSIS — Z124 Encounter for screening for malignant neoplasm of cervix: Secondary | ICD-10-CM | POA: Diagnosis not present

## 2022-01-09 DIAGNOSIS — L900 Lichen sclerosus et atrophicus: Secondary | ICD-10-CM | POA: Diagnosis not present

## 2022-01-09 DIAGNOSIS — Z1331 Encounter for screening for depression: Secondary | ICD-10-CM | POA: Diagnosis not present

## 2022-01-16 ENCOUNTER — Other Ambulatory Visit: Payer: Self-pay | Admitting: Obstetrics and Gynecology

## 2022-01-16 DIAGNOSIS — Z1231 Encounter for screening mammogram for malignant neoplasm of breast: Secondary | ICD-10-CM

## 2022-02-20 ENCOUNTER — Ambulatory Visit: Payer: PPO

## 2022-02-20 DIAGNOSIS — E785 Hyperlipidemia, unspecified: Secondary | ICD-10-CM | POA: Diagnosis not present

## 2022-02-20 DIAGNOSIS — E079 Disorder of thyroid, unspecified: Secondary | ICD-10-CM | POA: Diagnosis not present

## 2022-02-20 DIAGNOSIS — I10 Essential (primary) hypertension: Secondary | ICD-10-CM | POA: Diagnosis not present

## 2022-02-22 DIAGNOSIS — J0100 Acute maxillary sinusitis, unspecified: Secondary | ICD-10-CM | POA: Diagnosis not present

## 2022-03-27 ENCOUNTER — Ambulatory Visit: Payer: PPO

## 2022-04-12 ENCOUNTER — Ambulatory Visit
Admission: RE | Admit: 2022-04-12 | Discharge: 2022-04-12 | Disposition: A | Discharge status: Home / Self Care | Payer: PPO | Source: Ambulatory Visit | Attending: Obstetrics and Gynecology | Admitting: Obstetrics and Gynecology

## 2022-04-12 DIAGNOSIS — Z1231 Encounter for screening mammogram for malignant neoplasm of breast: Secondary | ICD-10-CM

## 2022-04-12 HISTORY — DX: Personal history of antineoplastic chemotherapy: Z92.21

## 2022-04-17 DIAGNOSIS — E079 Disorder of thyroid, unspecified: Secondary | ICD-10-CM | POA: Diagnosis not present

## 2022-04-24 DIAGNOSIS — C801 Malignant (primary) neoplasm, unspecified: Secondary | ICD-10-CM | POA: Diagnosis not present

## 2022-04-24 DIAGNOSIS — R053 Chronic cough: Secondary | ICD-10-CM | POA: Diagnosis not present

## 2022-04-24 DIAGNOSIS — I10 Essential (primary) hypertension: Secondary | ICD-10-CM | POA: Diagnosis not present

## 2022-04-24 DIAGNOSIS — Z0000 Encounter for general adult medical examination without abnormal findings: Secondary | ICD-10-CM | POA: Diagnosis not present

## 2022-04-24 DIAGNOSIS — E079 Disorder of thyroid, unspecified: Secondary | ICD-10-CM | POA: Diagnosis not present

## 2022-04-24 DIAGNOSIS — K219 Gastro-esophageal reflux disease without esophagitis: Secondary | ICD-10-CM | POA: Diagnosis not present

## 2022-04-24 DIAGNOSIS — R946 Abnormal results of thyroid function studies: Secondary | ICD-10-CM | POA: Diagnosis not present

## 2022-04-24 DIAGNOSIS — E785 Hyperlipidemia, unspecified: Secondary | ICD-10-CM | POA: Diagnosis not present

## 2022-05-03 DIAGNOSIS — K219 Gastro-esophageal reflux disease without esophagitis: Secondary | ICD-10-CM | POA: Diagnosis not present

## 2022-05-03 DIAGNOSIS — R053 Chronic cough: Secondary | ICD-10-CM | POA: Diagnosis not present

## 2022-05-03 DIAGNOSIS — J302 Other seasonal allergic rhinitis: Secondary | ICD-10-CM | POA: Diagnosis not present

## 2022-05-04 DIAGNOSIS — R946 Abnormal results of thyroid function studies: Secondary | ICD-10-CM | POA: Diagnosis not present

## 2022-06-02 DIAGNOSIS — R0789 Other chest pain: Secondary | ICD-10-CM | POA: Diagnosis not present

## 2022-07-09 DIAGNOSIS — E876 Hypokalemia: Secondary | ICD-10-CM | POA: Diagnosis not present

## 2022-07-09 DIAGNOSIS — R809 Proteinuria, unspecified: Secondary | ICD-10-CM | POA: Diagnosis not present

## 2022-07-09 DIAGNOSIS — I10 Essential (primary) hypertension: Secondary | ICD-10-CM | POA: Diagnosis not present

## 2022-07-09 DIAGNOSIS — N1831 Chronic kidney disease, stage 3a: Secondary | ICD-10-CM | POA: Diagnosis not present

## 2022-07-09 DIAGNOSIS — R319 Hematuria, unspecified: Secondary | ICD-10-CM | POA: Diagnosis not present

## 2022-07-09 DIAGNOSIS — N179 Acute kidney failure, unspecified: Secondary | ICD-10-CM | POA: Diagnosis not present

## 2022-07-13 DIAGNOSIS — J0100 Acute maxillary sinusitis, unspecified: Secondary | ICD-10-CM | POA: Diagnosis not present

## 2022-07-17 DIAGNOSIS — I129 Hypertensive chronic kidney disease with stage 1 through stage 4 chronic kidney disease, or unspecified chronic kidney disease: Secondary | ICD-10-CM | POA: Diagnosis not present

## 2022-07-17 DIAGNOSIS — N182 Chronic kidney disease, stage 2 (mild): Secondary | ICD-10-CM | POA: Diagnosis not present

## 2022-10-25 DIAGNOSIS — I10 Essential (primary) hypertension: Secondary | ICD-10-CM | POA: Diagnosis not present

## 2022-10-25 DIAGNOSIS — K219 Gastro-esophageal reflux disease without esophagitis: Secondary | ICD-10-CM | POA: Diagnosis not present

## 2022-10-25 DIAGNOSIS — F3342 Major depressive disorder, recurrent, in full remission: Secondary | ICD-10-CM | POA: Diagnosis not present

## 2022-10-25 DIAGNOSIS — E785 Hyperlipidemia, unspecified: Secondary | ICD-10-CM | POA: Diagnosis not present

## 2022-10-25 DIAGNOSIS — C801 Malignant (primary) neoplasm, unspecified: Secondary | ICD-10-CM | POA: Diagnosis not present

## 2022-10-25 DIAGNOSIS — E079 Disorder of thyroid, unspecified: Secondary | ICD-10-CM | POA: Diagnosis not present

## 2022-10-25 DIAGNOSIS — I779 Disorder of arteries and arterioles, unspecified: Secondary | ICD-10-CM | POA: Diagnosis not present

## 2022-11-01 DIAGNOSIS — K219 Gastro-esophageal reflux disease without esophagitis: Secondary | ICD-10-CM | POA: Diagnosis not present

## 2022-11-01 DIAGNOSIS — J449 Chronic obstructive pulmonary disease, unspecified: Secondary | ICD-10-CM | POA: Diagnosis not present

## 2022-11-01 DIAGNOSIS — R053 Chronic cough: Secondary | ICD-10-CM | POA: Diagnosis not present

## 2022-11-26 DIAGNOSIS — Z9012 Acquired absence of left breast and nipple: Secondary | ICD-10-CM | POA: Diagnosis not present

## 2022-11-26 DIAGNOSIS — C50112 Malignant neoplasm of central portion of left female breast: Secondary | ICD-10-CM | POA: Diagnosis not present

## 2022-11-26 DIAGNOSIS — Z4432 Encounter for fitting and adjustment of external left breast prosthesis: Secondary | ICD-10-CM | POA: Diagnosis not present

## 2022-12-13 DIAGNOSIS — E079 Disorder of thyroid, unspecified: Secondary | ICD-10-CM | POA: Diagnosis not present

## 2023-01-15 DIAGNOSIS — Z01419 Encounter for gynecological examination (general) (routine) without abnormal findings: Secondary | ICD-10-CM | POA: Diagnosis not present

## 2023-01-15 DIAGNOSIS — Z01411 Encounter for gynecological examination (general) (routine) with abnormal findings: Secondary | ICD-10-CM | POA: Diagnosis not present

## 2023-01-15 DIAGNOSIS — Z1331 Encounter for screening for depression: Secondary | ICD-10-CM | POA: Diagnosis not present

## 2023-01-24 ENCOUNTER — Other Ambulatory Visit: Payer: Self-pay | Admitting: Obstetrics and Gynecology

## 2023-01-24 DIAGNOSIS — Z1231 Encounter for screening mammogram for malignant neoplasm of breast: Secondary | ICD-10-CM

## 2023-01-29 DIAGNOSIS — E079 Disorder of thyroid, unspecified: Secondary | ICD-10-CM | POA: Diagnosis not present

## 2023-04-16 ENCOUNTER — Ambulatory Visit: Payer: PPO

## 2023-04-18 DIAGNOSIS — M25561 Pain in right knee: Secondary | ICD-10-CM | POA: Diagnosis not present

## 2023-04-18 DIAGNOSIS — I10 Essential (primary) hypertension: Secondary | ICD-10-CM | POA: Diagnosis not present

## 2023-04-18 DIAGNOSIS — Z780 Asymptomatic menopausal state: Secondary | ICD-10-CM | POA: Diagnosis not present

## 2023-04-18 DIAGNOSIS — N1831 Chronic kidney disease, stage 3a: Secondary | ICD-10-CM | POA: Diagnosis not present

## 2023-04-25 DIAGNOSIS — E785 Hyperlipidemia, unspecified: Secondary | ICD-10-CM | POA: Diagnosis not present

## 2023-04-25 DIAGNOSIS — E079 Disorder of thyroid, unspecified: Secondary | ICD-10-CM | POA: Diagnosis not present

## 2023-04-25 DIAGNOSIS — I10 Essential (primary) hypertension: Secondary | ICD-10-CM | POA: Diagnosis not present

## 2023-05-02 ENCOUNTER — Ambulatory Visit: Payer: PPO

## 2023-05-02 DIAGNOSIS — M25561 Pain in right knee: Secondary | ICD-10-CM | POA: Diagnosis not present

## 2023-05-02 DIAGNOSIS — N1831 Chronic kidney disease, stage 3a: Secondary | ICD-10-CM | POA: Diagnosis not present

## 2023-05-02 DIAGNOSIS — I10 Essential (primary) hypertension: Secondary | ICD-10-CM | POA: Diagnosis not present

## 2023-05-02 DIAGNOSIS — K219 Gastro-esophageal reflux disease without esophagitis: Secondary | ICD-10-CM | POA: Diagnosis not present

## 2023-05-02 DIAGNOSIS — Z0000 Encounter for general adult medical examination without abnormal findings: Secondary | ICD-10-CM | POA: Diagnosis not present

## 2023-05-02 DIAGNOSIS — E079 Disorder of thyroid, unspecified: Secondary | ICD-10-CM | POA: Diagnosis not present

## 2023-05-02 DIAGNOSIS — E785 Hyperlipidemia, unspecified: Secondary | ICD-10-CM | POA: Diagnosis not present

## 2023-05-03 DIAGNOSIS — M25561 Pain in right knee: Secondary | ICD-10-CM | POA: Diagnosis not present

## 2023-05-03 DIAGNOSIS — M1711 Unilateral primary osteoarthritis, right knee: Secondary | ICD-10-CM | POA: Diagnosis not present

## 2023-05-09 ENCOUNTER — Ambulatory Visit: Payer: PPO

## 2023-05-14 DIAGNOSIS — M8588 Other specified disorders of bone density and structure, other site: Secondary | ICD-10-CM | POA: Diagnosis not present

## 2023-06-18 ENCOUNTER — Ambulatory Visit
Admission: RE | Admit: 2023-06-18 | Discharge: 2023-06-18 | Disposition: A | Discharge status: Home / Self Care | Payer: PPO | Source: Ambulatory Visit | Attending: Obstetrics and Gynecology | Admitting: Obstetrics and Gynecology

## 2023-06-18 DIAGNOSIS — Z1231 Encounter for screening mammogram for malignant neoplasm of breast: Secondary | ICD-10-CM | POA: Diagnosis not present

## 2023-07-16 DIAGNOSIS — I129 Hypertensive chronic kidney disease with stage 1 through stage 4 chronic kidney disease, or unspecified chronic kidney disease: Secondary | ICD-10-CM | POA: Diagnosis not present

## 2023-07-16 DIAGNOSIS — N182 Chronic kidney disease, stage 2 (mild): Secondary | ICD-10-CM | POA: Diagnosis not present

## 2023-07-23 DIAGNOSIS — E876 Hypokalemia: Secondary | ICD-10-CM | POA: Diagnosis not present

## 2023-07-23 DIAGNOSIS — I10 Essential (primary) hypertension: Secondary | ICD-10-CM | POA: Diagnosis not present

## 2023-10-29 DIAGNOSIS — E079 Disorder of thyroid, unspecified: Secondary | ICD-10-CM | POA: Diagnosis not present

## 2023-11-05 DIAGNOSIS — J449 Chronic obstructive pulmonary disease, unspecified: Secondary | ICD-10-CM | POA: Diagnosis not present

## 2023-11-05 DIAGNOSIS — E079 Disorder of thyroid, unspecified: Secondary | ICD-10-CM | POA: Diagnosis not present

## 2023-11-05 DIAGNOSIS — E785 Hyperlipidemia, unspecified: Secondary | ICD-10-CM | POA: Diagnosis not present

## 2023-11-05 DIAGNOSIS — R5381 Other malaise: Secondary | ICD-10-CM | POA: Diagnosis not present

## 2023-11-05 DIAGNOSIS — R5383 Other fatigue: Secondary | ICD-10-CM | POA: Diagnosis not present

## 2023-11-05 DIAGNOSIS — M25561 Pain in right knee: Secondary | ICD-10-CM | POA: Diagnosis not present

## 2023-11-05 DIAGNOSIS — I10 Essential (primary) hypertension: Secondary | ICD-10-CM | POA: Diagnosis not present

## 2023-12-10 DIAGNOSIS — M25561 Pain in right knee: Secondary | ICD-10-CM | POA: Diagnosis not present

## 2023-12-17 DIAGNOSIS — M25561 Pain in right knee: Secondary | ICD-10-CM | POA: Diagnosis not present

## 2023-12-19 DIAGNOSIS — J301 Allergic rhinitis due to pollen: Secondary | ICD-10-CM | POA: Diagnosis not present

## 2023-12-19 DIAGNOSIS — J0101 Acute recurrent maxillary sinusitis: Secondary | ICD-10-CM | POA: Diagnosis not present

## 2023-12-19 DIAGNOSIS — J3489 Other specified disorders of nose and nasal sinuses: Secondary | ICD-10-CM | POA: Diagnosis not present

## 2023-12-19 DIAGNOSIS — N1831 Chronic kidney disease, stage 3a: Secondary | ICD-10-CM | POA: Diagnosis not present

## 2023-12-19 DIAGNOSIS — R6889 Other general symptoms and signs: Secondary | ICD-10-CM | POA: Diagnosis not present

## 2023-12-31 DIAGNOSIS — M25561 Pain in right knee: Secondary | ICD-10-CM | POA: Diagnosis not present

## 2024-01-02 DIAGNOSIS — M25561 Pain in right knee: Secondary | ICD-10-CM | POA: Diagnosis not present

## 2024-01-07 DIAGNOSIS — M25561 Pain in right knee: Secondary | ICD-10-CM | POA: Diagnosis not present

## 2024-01-31 DIAGNOSIS — E079 Disorder of thyroid, unspecified: Secondary | ICD-10-CM | POA: Diagnosis not present

## 2024-02-04 DIAGNOSIS — Z1331 Encounter for screening for depression: Secondary | ICD-10-CM | POA: Diagnosis not present

## 2024-02-04 DIAGNOSIS — Z124 Encounter for screening for malignant neoplasm of cervix: Secondary | ICD-10-CM | POA: Diagnosis not present

## 2024-02-06 DIAGNOSIS — N1831 Chronic kidney disease, stage 3a: Secondary | ICD-10-CM | POA: Diagnosis not present

## 2024-02-06 DIAGNOSIS — K219 Gastro-esophageal reflux disease without esophagitis: Secondary | ICD-10-CM | POA: Diagnosis not present

## 2024-02-06 DIAGNOSIS — J449 Chronic obstructive pulmonary disease, unspecified: Secondary | ICD-10-CM | POA: Diagnosis not present

## 2024-02-06 DIAGNOSIS — Z1331 Encounter for screening for depression: Secondary | ICD-10-CM | POA: Diagnosis not present

## 2024-02-06 DIAGNOSIS — J301 Allergic rhinitis due to pollen: Secondary | ICD-10-CM | POA: Diagnosis not present

## 2024-05-07 DIAGNOSIS — J069 Acute upper respiratory infection, unspecified: Secondary | ICD-10-CM | POA: Diagnosis not present

## 2024-05-12 DIAGNOSIS — E079 Disorder of thyroid, unspecified: Secondary | ICD-10-CM | POA: Diagnosis not present

## 2024-05-12 DIAGNOSIS — I10 Essential (primary) hypertension: Secondary | ICD-10-CM | POA: Diagnosis not present

## 2024-05-12 DIAGNOSIS — E785 Hyperlipidemia, unspecified: Secondary | ICD-10-CM | POA: Diagnosis not present

## 2024-05-15 ENCOUNTER — Other Ambulatory Visit: Payer: Self-pay | Admitting: Obstetrics and Gynecology

## 2024-05-15 DIAGNOSIS — Z1231 Encounter for screening mammogram for malignant neoplasm of breast: Secondary | ICD-10-CM

## 2024-05-19 DIAGNOSIS — Z0000 Encounter for general adult medical examination without abnormal findings: Secondary | ICD-10-CM | POA: Diagnosis not present

## 2024-05-19 DIAGNOSIS — Z1331 Encounter for screening for depression: Secondary | ICD-10-CM | POA: Diagnosis not present

## 2024-05-19 DIAGNOSIS — F32A Depression, unspecified: Secondary | ICD-10-CM | POA: Diagnosis not present

## 2024-05-19 DIAGNOSIS — E079 Disorder of thyroid, unspecified: Secondary | ICD-10-CM | POA: Diagnosis not present

## 2024-05-19 DIAGNOSIS — M25541 Pain in joints of right hand: Secondary | ICD-10-CM | POA: Diagnosis not present

## 2024-05-19 DIAGNOSIS — J449 Chronic obstructive pulmonary disease, unspecified: Secondary | ICD-10-CM | POA: Diagnosis not present

## 2024-05-19 DIAGNOSIS — I10 Essential (primary) hypertension: Secondary | ICD-10-CM | POA: Diagnosis not present

## 2024-05-19 DIAGNOSIS — E785 Hyperlipidemia, unspecified: Secondary | ICD-10-CM | POA: Diagnosis not present

## 2024-06-23 ENCOUNTER — Ambulatory Visit
Admission: RE | Admit: 2024-06-23 | Discharge: 2024-06-23 | Disposition: A | Discharge status: Home / Self Care | Source: Ambulatory Visit | Attending: Obstetrics and Gynecology | Admitting: Obstetrics and Gynecology

## 2024-06-23 DIAGNOSIS — Z1231 Encounter for screening mammogram for malignant neoplasm of breast: Secondary | ICD-10-CM | POA: Diagnosis not present

## 2024-07-28 DIAGNOSIS — M25551 Pain in right hip: Secondary | ICD-10-CM | POA: Diagnosis not present
